# Patient Record
Sex: Female | Born: 1935 | Race: White | Hispanic: No | Smoking: Never smoker
Health system: Southern US, Community
[De-identification: ages and names within clinical notes are randomized; demographics above are authoritative.]

## PROBLEM LIST (undated history)

## (undated) DIAGNOSIS — K219 Gastro-esophageal reflux disease without esophagitis: Secondary | ICD-10-CM

## (undated) DIAGNOSIS — I509 Heart failure, unspecified: Secondary | ICD-10-CM

## (undated) DIAGNOSIS — G4700 Insomnia, unspecified: Secondary | ICD-10-CM

## (undated) DIAGNOSIS — E039 Hypothyroidism, unspecified: Secondary | ICD-10-CM

## (undated) DIAGNOSIS — I10 Essential (primary) hypertension: Secondary | ICD-10-CM

## (undated) DIAGNOSIS — I639 Cerebral infarction, unspecified: Secondary | ICD-10-CM

## (undated) DIAGNOSIS — F0390 Unspecified dementia without behavioral disturbance: Secondary | ICD-10-CM

## (undated) HISTORY — PX: ABDOMINAL HYSTERECTOMY: SHX81

---

## 2004-10-17 ENCOUNTER — Ambulatory Visit: Payer: Self-pay | Admitting: Internal Medicine

## 2004-12-22 ENCOUNTER — Emergency Department: Payer: Self-pay | Admitting: Unknown Physician Specialty

## 2004-12-28 ENCOUNTER — Other Ambulatory Visit: Payer: Self-pay

## 2004-12-28 ENCOUNTER — Inpatient Hospital Stay: Payer: Self-pay | Admitting: Internal Medicine

## 2005-12-02 ENCOUNTER — Ambulatory Visit: Payer: Self-pay | Admitting: Internal Medicine

## 2006-12-09 ENCOUNTER — Ambulatory Visit: Payer: Self-pay | Admitting: Internal Medicine

## 2008-02-08 ENCOUNTER — Ambulatory Visit: Payer: Self-pay | Admitting: Internal Medicine

## 2008-05-11 ENCOUNTER — Emergency Department: Payer: Self-pay | Admitting: Emergency Medicine

## 2008-05-16 ENCOUNTER — Ambulatory Visit: Payer: Self-pay | Admitting: Internal Medicine

## 2008-05-22 ENCOUNTER — Ambulatory Visit: Payer: Self-pay | Admitting: Internal Medicine

## 2009-02-13 ENCOUNTER — Ambulatory Visit: Payer: Self-pay | Admitting: Internal Medicine

## 2009-09-05 IMAGING — CR DG CHEST 2V
1 series · 2 of 2 positions shown · non-contrast
Comparison: none

REASON FOR EXAM: SHORTNESS OF BREATH
COMMENTS:

[Series 1: view not recorded · 0.17mm/px · 2 of 2 slices shown]
[im 1/2]
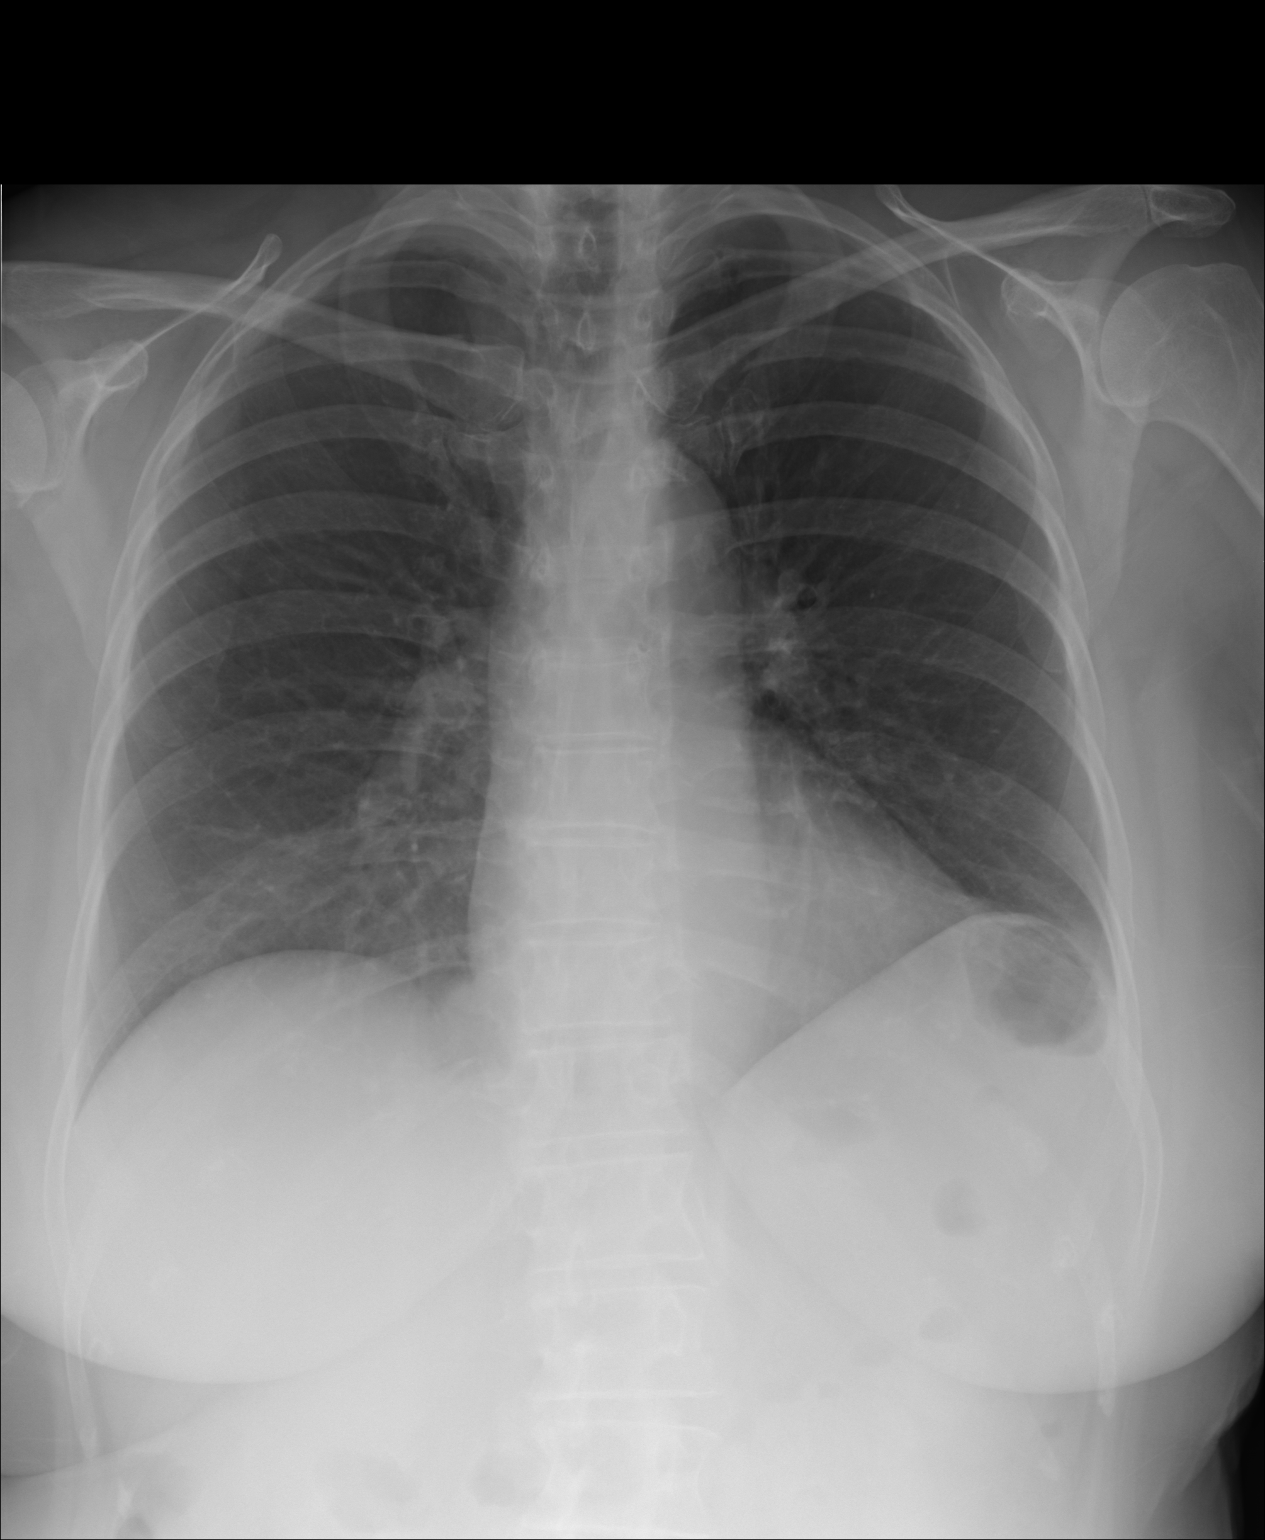
[im 2/2]
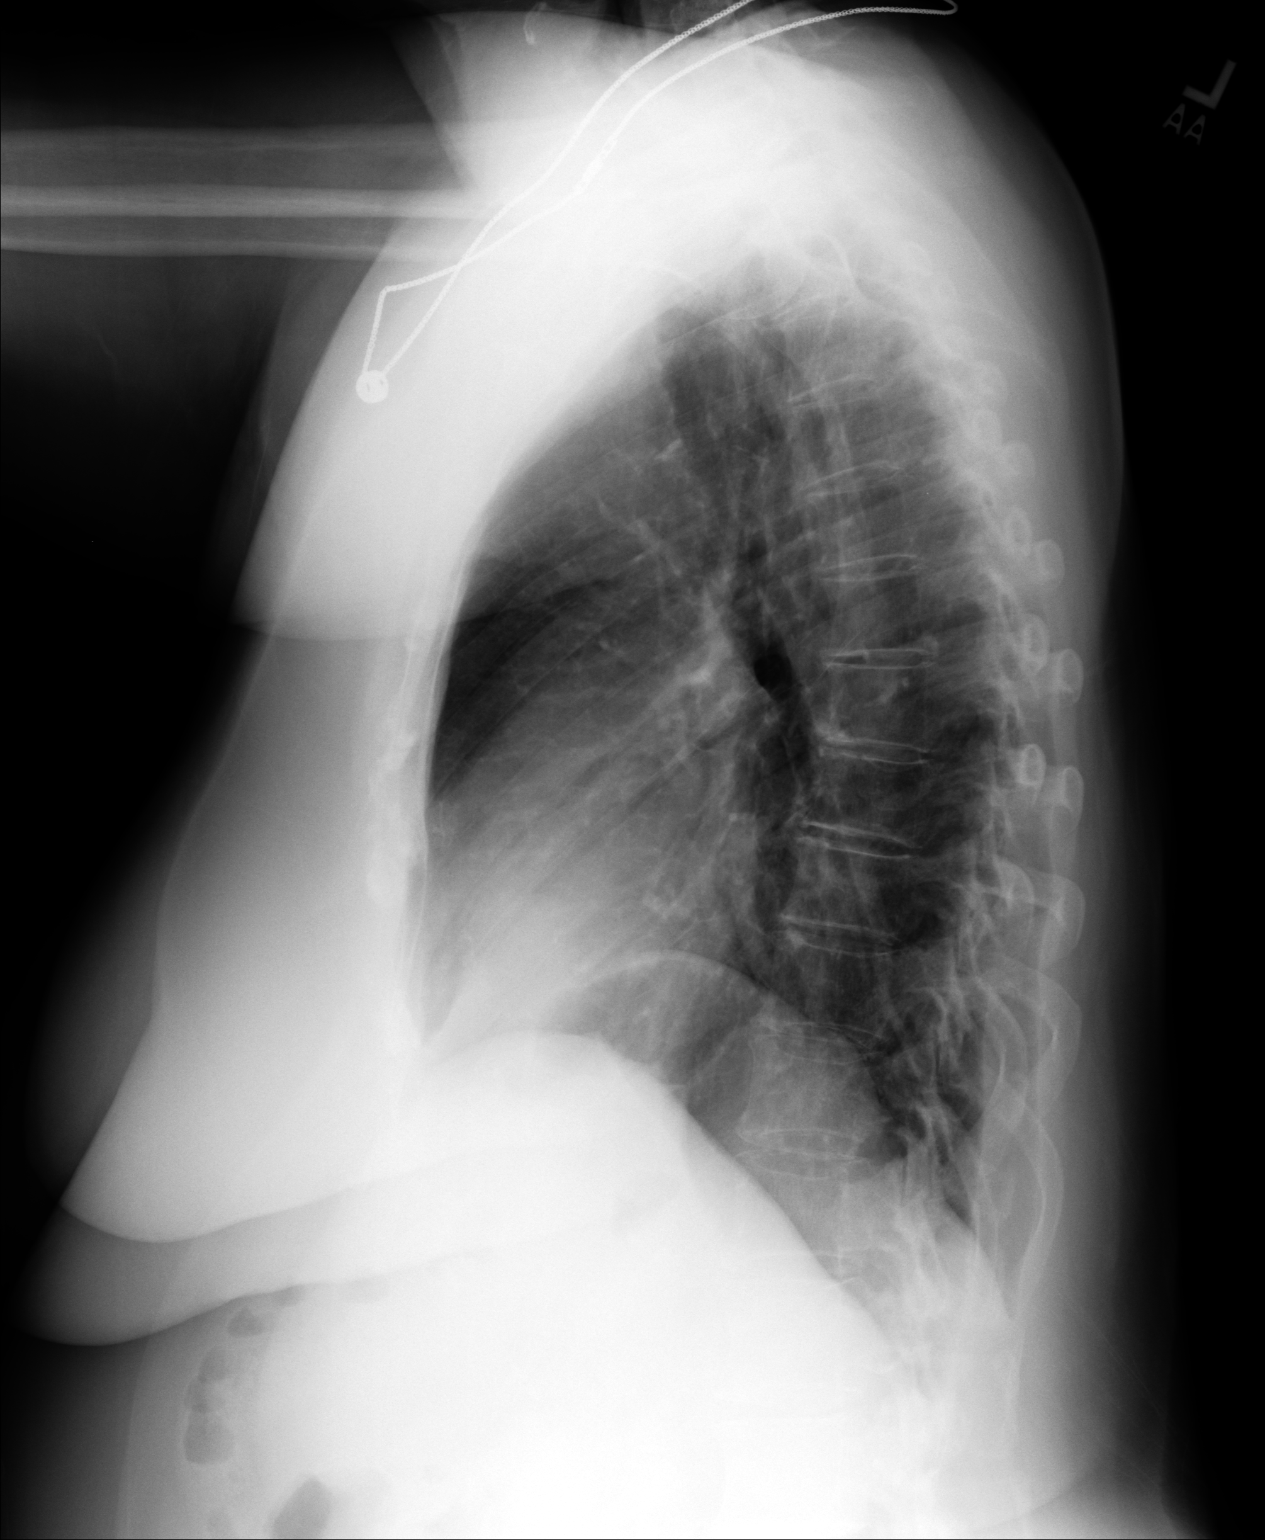

[2 of 2 positions shown; findings below may reference images not displayed]

PROCEDURE:     DXR - DXR CHEST PA (OR AP) AND LATERAL  - May 22, 2008 [DATE]

RESULT:     The patient has taken a shallow inspiration. A small area of
increased density projects along the cardiophrenic angle region on the right
in the area of the right middle lobe. This may be secondary to the patient's
shallow inspiration and repeat evaluation is recommended. No further focal
regions of consolidation or focal infiltrates are appreciated.
IMPRESSION: Area of increased density projecting in the medial aspect
of the right middle lobe. Differential considerations are atelectasis versus
infiltrate versus area of density secondary to technique. Repeat evaluation
is recommended.

## 2010-02-19 ENCOUNTER — Ambulatory Visit: Payer: Self-pay | Admitting: Internal Medicine

## 2010-04-09 ENCOUNTER — Ambulatory Visit: Payer: Self-pay | Admitting: Internal Medicine

## 2010-08-15 ENCOUNTER — Emergency Department: Payer: Self-pay | Admitting: Emergency Medicine

## 2011-03-19 ENCOUNTER — Ambulatory Visit: Payer: Self-pay | Admitting: Internal Medicine

## 2011-07-03 ENCOUNTER — Ambulatory Visit: Payer: Self-pay | Admitting: Urology

## 2011-08-03 ENCOUNTER — Inpatient Hospital Stay: Payer: Self-pay | Admitting: Internal Medicine

## 2011-08-03 LAB — CBC
HGB: 11.1 g/dL — ABNORMAL LOW (ref 12.0–16.0)
MCH: 31.5 pg (ref 26.0–34.0)
MCHC: 34.3 g/dL (ref 32.0–36.0)
MCV: 92 fL (ref 80–100)
Platelet: 293 10*3/uL (ref 150–440)
RDW: 13.6 % (ref 11.5–14.5)
WBC: 9.4 10*3/uL (ref 3.6–11.0)

## 2011-08-03 LAB — COMPREHENSIVE METABOLIC PANEL
Anion Gap: 15 (ref 7–16)
BUN: 16 mg/dL (ref 7–18)
Calcium, Total: 8.7 mg/dL (ref 8.5–10.1)
Chloride: 85 mmol/L — ABNORMAL LOW (ref 98–107)
Creatinine: 1.14 mg/dL (ref 0.60–1.30)
EGFR (African American): 58 — ABNORMAL LOW
Glucose: 107 mg/dL — ABNORMAL HIGH (ref 65–99)
Osmolality: 249 (ref 275–301)
Potassium: 3.4 mmol/L — ABNORMAL LOW (ref 3.5–5.1)
SGOT(AST): 33 U/L (ref 15–37)
Total Protein: 7 g/dL (ref 6.4–8.2)

## 2011-08-03 LAB — BASIC METABOLIC PANEL
Anion Gap: 8 (ref 7–16)
Calcium, Total: 8.7 mg/dL (ref 8.5–10.1)
Creatinine: 1.01 mg/dL (ref 0.60–1.30)
EGFR (African American): >60
EGFR (Non-African Amer.): 55 — ABNORMAL LOW
Glucose: 95 mg/dL (ref 65–99)
Osmolality: 251 (ref 275–301)
Potassium: 3.8 mmol/L (ref 3.5–5.1)
Sodium: 125 mmol/L — ABNORMAL LOW (ref 136–145)

## 2011-08-03 LAB — URINALYSIS, COMPLETE
Bacteria: NONE SEEN
Bilirubin,UR: NEGATIVE
Glucose,UR: NEGATIVE mg/dL (ref 0–75)
Nitrite: NEGATIVE
Squamous Epithelial: <1

## 2011-08-03 LAB — CK TOTAL AND CKMB (NOT AT ARMC)
CK, Total: 465 U/L — ABNORMAL HIGH (ref 21–215)
CK, Total: 497 U/L — ABNORMAL HIGH (ref 21–215)
CK-MB: 4.7 ng/mL — ABNORMAL HIGH (ref 0.5–3.6)

## 2011-08-03 LAB — CBC WITH DIFFERENTIAL/PLATELET
Basophil #: 0 10*3/uL (ref 0.0–0.1)
Basophil %: 0.5 %
Eosinophil #: 0.1 10*3/uL (ref 0.0–0.7)
HCT: 31.9 % — ABNORMAL LOW (ref 35.0–47.0)
HGB: 10.6 g/dL — ABNORMAL LOW (ref 12.0–16.0)
Lymphocyte #: 1.5 10*3/uL (ref 1.0–3.6)
MCHC: 33.4 g/dL (ref 32.0–36.0)
MCV: 91 fL (ref 80–100)
Monocyte #: 0.8 10*3/uL — ABNORMAL HIGH (ref 0.0–0.7)
Monocyte %: 11 %
Neutrophil #: 5 10*3/uL (ref 1.4–6.5)
RDW: 13.4 % (ref 11.5–14.5)

## 2011-08-03 LAB — TROPONIN I
Troponin-I: <0.02 ng/mL
Troponin-I: <0.02 ng/mL
Troponin-I: <0.02 ng/mL

## 2011-08-04 LAB — CLOSTRIDIUM DIFFICILE BY PCR

## 2011-08-05 LAB — CBC WITH DIFFERENTIAL/PLATELET
Basophil #: 0 10*3/uL (ref 0.0–0.1)
Basophil %: 0.6 %
Eosinophil #: 0 10*3/uL (ref 0.0–0.7)
Eosinophil %: 0.5 %
HCT: 34.2 % — ABNORMAL LOW (ref 35.0–47.0)
HGB: 11.5 g/dL — ABNORMAL LOW (ref 12.0–16.0)
Lymphocyte #: 1.2 10*3/uL (ref 1.0–3.6)
Lymphocyte %: 15.5 %
MCH: 30.9 pg (ref 26.0–34.0)
MCHC: 33.6 g/dL (ref 32.0–36.0)
MCV: 92 fL (ref 80–100)
Monocyte #: 0.8 10*3/uL — ABNORMAL HIGH (ref 0.0–0.7)
Monocyte %: 10.5 %
Neutrophil #: 5.6 10*3/uL (ref 1.4–6.5)
Neutrophil %: 72.9 %
Platelet: 341 10*3/uL (ref 150–440)
RBC: 3.72 10*6/uL — ABNORMAL LOW (ref 3.80–5.20)
RDW: 13.6 % (ref 11.5–14.5)
WBC: 7.8 10*3/uL (ref 3.6–11.0)

## 2011-08-05 LAB — BASIC METABOLIC PANEL
Anion Gap: 16 (ref 7–16)
BUN: 10 mg/dL (ref 7–18)
Calcium, Total: 9.1 mg/dL (ref 8.5–10.1)
Chloride: 92 mmol/L — ABNORMAL LOW (ref 98–107)
Co2: 20 mmol/L — ABNORMAL LOW (ref 21–32)
Creatinine: 0.98 mg/dL (ref 0.60–1.30)
EGFR (African American): >60
EGFR (Non-African Amer.): 57 — ABNORMAL LOW
Glucose: 111 mg/dL — ABNORMAL HIGH (ref 65–99)
Osmolality: 257 (ref 275–301)
Potassium: 4.5 mmol/L (ref 3.5–5.1)
Sodium: 128 mmol/L — ABNORMAL LOW (ref 136–145)

## 2012-01-10 ENCOUNTER — Emergency Department: Payer: Self-pay | Admitting: Emergency Medicine

## 2012-01-10 LAB — CBC
HCT: 36.2 % (ref 35.0–47.0)
HGB: 12.3 g/dL (ref 12.0–16.0)
MCH: 30.5 pg (ref 26.0–34.0)
MCHC: 33.9 g/dL (ref 32.0–36.0)
MCV: 90 fL (ref 80–100)
RBC: 4.02 10*6/uL (ref 3.80–5.20)
RDW: 13.6 % (ref 11.5–14.5)
WBC: 8.2 10*3/uL (ref 3.6–11.0)

## 2012-01-10 LAB — PROTIME-INR
INR: 0.8
Prothrombin Time: 11.8 secs (ref 11.5–14.7)

## 2012-03-31 ENCOUNTER — Ambulatory Visit: Payer: Self-pay | Admitting: Family Medicine

## 2012-06-09 ENCOUNTER — Emergency Department: Payer: Self-pay | Admitting: Emergency Medicine

## 2012-06-20 ENCOUNTER — Emergency Department: Payer: Self-pay | Admitting: Unknown Physician Specialty

## 2012-10-26 ENCOUNTER — Emergency Department: Payer: Self-pay | Admitting: Emergency Medicine

## 2012-11-20 ENCOUNTER — Emergency Department: Payer: Self-pay | Admitting: Emergency Medicine

## 2013-01-21 ENCOUNTER — Emergency Department: Payer: Self-pay | Admitting: Emergency Medicine

## 2013-03-09 ENCOUNTER — Emergency Department: Payer: Self-pay | Admitting: Emergency Medicine

## 2013-03-09 LAB — COMPREHENSIVE METABOLIC PANEL
Alkaline Phosphatase: 115 U/L (ref 50–136)
Anion Gap: 6 — ABNORMAL LOW (ref 7–16)
Co2: 27 mmol/L (ref 21–32)
Creatinine: 1.3 mg/dL (ref 0.60–1.30)
Glucose: 103 mg/dL — ABNORMAL HIGH (ref 65–99)
Potassium: 3.7 mmol/L (ref 3.5–5.1)
SGPT (ALT): 18 U/L (ref 12–78)
Sodium: 138 mmol/L (ref 136–145)
Total Protein: 6.8 g/dL (ref 6.4–8.2)

## 2013-03-09 LAB — CBC
MCHC: 34.5 g/dL (ref 32.0–36.0)
MCV: 86 fL (ref 80–100)
Platelet: 349 10*3/uL (ref 150–440)
RBC: 3.85 10*6/uL (ref 3.80–5.20)
RDW: 13.6 % (ref 11.5–14.5)

## 2013-03-09 LAB — URINALYSIS, COMPLETE
Bacteria: NONE SEEN
Bilirubin,UR: NEGATIVE
Blood: NEGATIVE
Glucose,UR: NEGATIVE mg/dL (ref 0–75)
Leukocyte Esterase: NEGATIVE
Nitrite: NEGATIVE
Protein: NEGATIVE
RBC,UR: 1 /HPF (ref 0–5)
Specific Gravity: 1.011 (ref 1.003–1.030)

## 2013-03-09 LAB — TROPONIN I: Troponin-I: <0.02 ng/mL

## 2013-03-09 LAB — CK TOTAL AND CKMB (NOT AT ARMC): CK-MB: 2.5 ng/mL (ref 0.5–3.6)

## 2014-09-10 NOTE — Discharge Summary (Signed)
PATIENT NAME:  Abigail, Mcclure MR#:  [id number] DATE OF BIRTH:  04-13-1922  DATE OF ADMISSION:  08/03/2011 DATE OF DISCHARGE:  08/06/2011  HISTORY OF PRESENT ILLNESS: Abigail Mcclure is an 81 year old female who was admitted into the hospital with recurrent falls at home for the last four days. She was admitted into the hospital with change in mental status, rule out transient ischemic attack, rule out electrolyte imbalance. For the rest of the details of the history and physical, please see typed history and physical sheet. The patient is known to have hypertension, dyslipidemia, kidney stone, and ulcer. She lives with her son. She has loss of memory.  CURRENT MEDICATIONS:  1. Simvastatin 10 mg p.o. daily. 2. Hydrochlorothiazide 12.5 mg p.o. daily. 3. Nitrofurantoin 100 mg p.o. daily. 4. K-Lor 20 milliequivalents daily. 5. Captopril 50 mg p.o. three times daily. 6. Fiorinal 50/325/40 mg one capsule by mouth every day as needed for headache. 7. Amlodipine 10 mg p.o. daily. 8. Metoprolol ER 25 mg one tablet p.o. three times daily. 9. Gabapentin 100 mg two capsules p.o. at bedtime for migraine headaches.   HOSPITAL COURSE: The patient was admitted into the hospital with the working diagnosis of TIA versus electrolyte imbalance. However, her sodium was found to be very low which may be secondary to diuretic. The patient was not found to have any focal neurological signs. The patient does not have any evidence of stroke or severe carotid artery disease on the carotid ultrasound, MRA, and MRI. CT scan did not show any bleed. The patient remained alert, responsive. Hyponatremia was slowly corrected. It was 116 to start with and 128 at the time of discharge. Hydrochlorothiazide was discontinued. She was hypotensive when she came in, but her blood pressure went back up and had to be started on her previous blood pressure medication. I had a detailed discussion with the son. It was decided to send her to a nursing  home.   DISCHARGE LABS/STUDIES: Blood sugar 111. Sodium 128. GFR 57. Hematocrit 34.2.   Urinalysis was unremarkable.   EKG did not show any acute changes.   FINAL DIAGNOSES:  1. Hyponatremia causing frequent falls. No evidence of acute bleed or stroke on imaging studies.  2. Hypertension with hypertensive cardiovascular disease  3. History of loss of memory with mild dementia.   DISPOSITION: The patient is being sent to a nursing home.  DISCHARGE MEDICATIONS: 1. Aspirin 81 mg p.o. daily.  2. Protonix 40 mg p.o. daily.  3. Simvastatin 10 mg p.o. daily.  4. Macrodantin 100 mg p.o. daily.  5. Neurontin 200 mg p.o. at bedtime.  6. Metoprolol 25 mg p.o. every 8 hours. 7. Fiorinal 325/50/40 mg one tablet p.o. p.r.n. once or twice a day for pain. 8. Potassium chloride 20 milliequivalents p.o. daily.  9. Captopril 50 mg p.o. every eight hours.  ____________________________ Corky Downs, MD jm:slb D: 08/06/2011 14:34:23 ET T: 08/06/2011 15:27:43 ET JOB#: [id number]  cc: Corky Downs, MD, <Dictator> Corky Downs MD ELECTRONICALLY SIGNED 08/10/2011 19:57

## 2014-09-10 NOTE — H&P (Signed)
PATIENT NAME:  Abigail, Mcclure MR#:  [id number] DATE OF BIRTH:  [birth year]  DATE OF ADMISSION:  08/03/2011  PRIMARY CARE PHYSICIAN: Abigail Downs, MD  INDICATION FOR ADMISSION: Hyponatremia, weakness, concern for TIA versus CVA.   CHIEF COMPLAINT: "Weakness, confusion, and recurrent falls for the last four days (per son)."  HISTORY OF PRESENT ILLNESS: Abigail Mcclure is a pleasant 81 year old Caucasian female with past medical history of hypertension, hyperlipidemia, and hyponatremia who presented with weakness, confusion, and recurrent falls for the past four days.  History is per the son and some from the patient. Her son states that on Wednesday the patient had a fall. She uses a walker and fell back and hit her head. At that time she had a small bruise on her head, but she did not seem to have any acute change in her mental status or overall strength in her daily activities. However, over the past four days he has noticed that the right eye is starting to become a little bit more droopy in the mornings and her right foot has turned inward.  Today she had another fall where she kind of slipped down to the ground. She was too weak to use her walker.  The son also noticed that she has been urinating a lot more than normal. No fevers, no nausea, vomiting or diarrhea, and no shortness of breath. The son states that she was admitted for hyponatremia about 4 to 5 years ago by Dr. Corky Mcclure and she chronically has low sodium, but he does not know what her baseline sodium is.  Upon arrival to the ED, her sodium was noted to be 123. She was noted to have bruises on her forearm and on her back secondary to falls. Hospitalist services were consulted for further inpatient and management and treatment.   PAST MEDICAL HISTORY:  1. Hypertension. 2. Hyperlipidemia. 3. History of kidney stones. 4. History of ulcers.  PAST SURGICAL HISTORY: None.  DRUG ALLERGIES: No known drug allergies.  HOME MEDICATIONS:   1. Simvastatin 10 mg daily. 2. Hydrochlorothiazide 12.5 mg daily and 25 mg daily (the patient has two prescriptions). 3. Nitrofurantoin 100 mg daily. 4. Klor-Con 20 milliequivalents one tablet daily. 5. Captopril 50 mg three times daily. 6. Fiorinal capsules 50/325/40 mg take one capsule by mouth every day as needed for pain.  7. Amlodipine 10 mg one tablet daily. 8. Metoprolol ER 25 mg one tablet three times daily.  9. Gabapentin 100 mg two capsules p.o. at bedtime.  FAMILY HISTORY: Mother and father and two brothers died of a CVA.   SOCIAL HISTORY: No tobacco or alcohol use. She is a retired Education officer, environmental.   REVIEW OF SYSTEMS: (Per the son and not per the patient) CONSTITUTIONAL: No fever or fatigue. Positive weakness. No pain, weight loss or weight gain.  EYES: No blurred vision, double vision, pain, or redness. ENT: No tinnitus, ear pain, hearing loss, or seasonal allergies. RESPIRATORY: No cough, wheezing, dyspnea, or asthma. CARDIOVASCULAR: No chest pain, orthopnea, edema, arrhythmias, or dyspnea on exertion. GI: No nausea, vomiting, diarrhea, or abdominal pain. GU: No dysuria or hematuria or renal calculi. ENDOCRINE: No polyuria, nocturia, or thyroid problems. HEME/LYMPH: No anemia, easy bruisability or swollen glands. INTEGUMENT: No acne, rash, lesions, or change in hair, mole, or skin. MUSCULOSKELETAL: No pain in the neck, back, shoulders, knees, or hips. Chronic arthritis and uses a walker. NEURO: No numbness. Positive weakness and confusion. PSYCH: No anxiety, insomnia, ADD, OCD, bipolar, depression, schizophrenia or nervousness.  PHYSICAL EXAMINATION:  VITALS:  Blood pressure 141/60, temperature 98.2, pulse 68, respirations 17.  GENERAL APPEARANCE: Well-developed, well-nourished obese female lying in bed in no acute respiratory distress.  HEENT:  Pupils are equally round and reactive to light and accommodation. Extraocular movements are intact. No scleral icterus. No  conjunctivitis. No difficulty hearing. Tympanic membranes are intact.    NECK: No thyroid enlargement. No nodules. Neck is supple and nontender. No adenopathy is noted.   RESPIRATORY: Clear to auscultation bilaterally. No rales, rhonchi, or crackles. No diminished breath sounds. No wheezing.  CARDIOVASCULAR: Regular rate and rhythm. S1 and S2. No murmurs. No lower extremity edema is noted.   ABDOMEN: Soft and nontender, nondistended. Positive bowel sounds.   MUSCULOSKELETAL: 4/5 strength in the right upper and right lower extremity, 5/5 strength in the left upper and left lower extremity.   SKIN: No rash. No erythema. No nodules. Skin is warm and dry.  There is a large bruise noted on the right forearm and on her back area.  LYMPH: No adenopathy noted in the cervical, axillary, or supraclavicular regions.   NEURO: Cranial nerves II through XII grossly intact. Deep tendon reflexes intact. Sensory is intact. She does have some mild slowing of her speech, but is able to answer distant memory questions appropriately.   PSYCH: Alert, oriented to place but not to time. Cooperative. Good decent judgment.   LABS/STUDIES: Sodium 123, potassium 3.4, chloride 85, bicarbonate 23, BUN 16, creatinine 1.14, glucose 107. White blood cell count 9.4, hemoglobin 11.1, hematocrit 32.5, platelet count 293.   Urinalysis: Leukocyte esterase negative. Nitrite negative. White blood cells - none seen.   CT of the head: No acute abnormalities. No acute bleed.   EKG: Normal sinus rhythm, first degree AV block, rate of about 70. No acute ST-T wave changes.   ASSESSMENT AND PLAN: An 81 year old female with past medical history of hypertension, hyperlipidemia, history of hyponatremia, and UTI being admitted for weakness, hyponatremia, confusion, and three falls in the past four days. 1. Weakness, confusion, and falls: Most likely secondary to TIA versus CVA. I believe the inciting event may have occurred on Wednesday  after her first fall. Although it may be related to trauma, we cannot completely rule out a CVA that lead to the fall. We will fully evaluate with MRI, MRA, and carotid ultrasounds. She may need a 2D echocardiogram of her heart. Her primary care physician is Dr. Corky Mcclure and we will defer Echo ordering to him. CT of the head is currently with no acute bleed or acute abnormalities. She will need followup of her CBC to monitor her hemoglobin and her sodium. We will monitor with a BMP in the a.m. Her urinalysis is currently negative for UTI. She is on nitrofurantoin chronically, by Dr. Corky Mcclure, and we will continue that.  2. Hyponatremia: Most likely acute on chronic. There is no record of a baseline sodium. She has been admitted in the past for hyponatremia, as low as 116. In the ED, she was started on 75 mL of normal saline. We will reduce this to about 50 mL of normal saline so as not to drastically increase her sodium in the event she has chronic hyponatremia. Ideally in the next 6 to 8 hours, we will probably prefer her sodium to be around 120 to 130 and not much more higher than that.   3. Hypertension: Continue with home medications of captopril and hydrochlorothiazide. However, we will hold hydrochlorothiazide right now secondary to hyponatremia. This  can be resumed by her primary care physician. 4. Hyperlipidemia: Continue with simvastatin.  5. GI and DVT prophylaxis: Continue with PPI and heparin.   CODE STATUS: Per the patient's son, the patient is  DNI/ DNR. She has a signed Living Will.   TIME SPENT DICTATING AND EVALUATING PATIENT: 40 minutes. ____________________________ Stephanie Acre, MD vm:slb D: 08/03/2011 02:23:13 ET T: 08/03/2011 09:21:10 ET JOB#: [id number]  cc: Stephanie Acre, MD, <Dictator> Abigail Downs, MD Stephanie Acre MD ELECTRONICALLY SIGNED 08/03/2011 22:16

## 2014-09-22 ENCOUNTER — Other Ambulatory Visit: Payer: Self-pay

## 2014-09-22 ENCOUNTER — Emergency Department
Admission: EM | Admit: 2014-09-22 | Discharge: 2014-09-22 | Payer: Medicare Other | Attending: Student | Admitting: Student

## 2014-09-22 ENCOUNTER — Emergency Department: Payer: Medicare Other

## 2014-09-22 ENCOUNTER — Ambulatory Visit (HOSPITAL_COMMUNITY)
Admission: AD | Admit: 2014-09-22 | Discharge: 2014-09-22 | Disposition: A | Discharge status: Home / Self Care | Payer: Medicare Other | Source: Other Acute Inpatient Hospital | Attending: Student | Admitting: Student

## 2014-09-22 ENCOUNTER — Encounter: Payer: Self-pay | Admitting: Emergency Medicine

## 2014-09-22 DIAGNOSIS — W050XXA Fall from non-moving wheelchair, initial encounter: Secondary | ICD-10-CM | POA: Insufficient documentation

## 2014-09-22 DIAGNOSIS — Y9289 Other specified places as the place of occurrence of the external cause: Secondary | ICD-10-CM | POA: Insufficient documentation

## 2014-09-22 DIAGNOSIS — I10 Essential (primary) hypertension: Secondary | ICD-10-CM | POA: Diagnosis not present

## 2014-09-22 DIAGNOSIS — S12000A Unspecified displaced fracture of first cervical vertebra, initial encounter for closed fracture: Secondary | ICD-10-CM | POA: Diagnosis not present

## 2014-09-22 DIAGNOSIS — Z7982 Long term (current) use of aspirin: Secondary | ICD-10-CM | POA: Insufficient documentation

## 2014-09-22 DIAGNOSIS — Y998 Other external cause status: Secondary | ICD-10-CM | POA: Insufficient documentation

## 2014-09-22 DIAGNOSIS — W19XXXA Unspecified fall, initial encounter: Secondary | ICD-10-CM

## 2014-09-22 DIAGNOSIS — Y9389 Activity, other specified: Secondary | ICD-10-CM | POA: Diagnosis not present

## 2014-09-22 DIAGNOSIS — W01198A Fall on same level from slipping, tripping and stumbling with subsequent striking against other object, initial encounter: Secondary | ICD-10-CM | POA: Diagnosis not present

## 2014-09-22 DIAGNOSIS — S0181XA Laceration without foreign body of other part of head, initial encounter: Secondary | ICD-10-CM | POA: Diagnosis not present

## 2014-09-22 DIAGNOSIS — R296 Repeated falls: Secondary | ICD-10-CM | POA: Diagnosis not present

## 2014-09-22 DIAGNOSIS — S129XXA Fracture of neck, unspecified, initial encounter: Secondary | ICD-10-CM | POA: Diagnosis present

## 2014-09-22 DIAGNOSIS — Z79899 Other long term (current) drug therapy: Secondary | ICD-10-CM | POA: Diagnosis not present

## 2014-09-22 DIAGNOSIS — Z23 Encounter for immunization: Secondary | ICD-10-CM | POA: Diagnosis not present

## 2014-09-22 DIAGNOSIS — T1490XA Injury, unspecified, initial encounter: Secondary | ICD-10-CM

## 2014-09-22 DIAGNOSIS — S3992XA Unspecified injury of lower back, initial encounter: Secondary | ICD-10-CM | POA: Diagnosis present

## 2014-09-22 HISTORY — DX: Insomnia, unspecified: G47.00

## 2014-09-22 HISTORY — DX: Hypothyroidism, unspecified: E03.9

## 2014-09-22 HISTORY — DX: Gastro-esophageal reflux disease without esophagitis: K21.9

## 2014-09-22 HISTORY — DX: Heart failure, unspecified: I50.9

## 2014-09-22 HISTORY — DX: Essential (primary) hypertension: I10

## 2014-09-22 HISTORY — DX: Unspecified dementia, unspecified severity, without behavioral disturbance, psychotic disturbance, mood disturbance, and anxiety: F03.90

## 2014-09-22 LAB — BASIC METABOLIC PANEL
ANION GAP: 8 (ref 5–15)
BUN: 18 mg/dL (ref 6–20)
CO2: 30 mmol/L (ref 22–32)
CREATININE: 1.03 mg/dL — AB (ref 0.44–1.00)
Calcium: 9.2 mg/dL (ref 8.9–10.3)
Chloride: 102 mmol/L (ref 101–111)
GFR, EST AFRICAN AMERICAN: 54 mL/min — AB (ref >60)
GFR, EST NON AFRICAN AMERICAN: 46 mL/min — AB (ref >60)
Glucose, Bld: 89 mg/dL (ref 65–99)
Potassium: 4 mmol/L (ref 3.5–5.1)
Sodium: 140 mmol/L (ref 135–145)

## 2014-09-22 LAB — CBC
HEMATOCRIT: 43.4 % (ref 35.0–47.0)
Hemoglobin: 14.2 g/dL (ref 12.0–16.0)
MCH: 29.7 pg (ref 26.0–34.0)
MCHC: 32.7 g/dL (ref 32.0–36.0)
MCV: 91.1 fL (ref 80.0–100.0)
PLATELETS: 261 10*3/uL (ref 150–440)
RBC: 4.76 MIL/uL (ref 3.80–5.20)
RDW: 14.9 % — ABNORMAL HIGH (ref 11.5–14.5)
WBC: 8.1 10*3/uL (ref 3.6–11.0)

## 2014-09-22 LAB — PROTIME-INR
INR: 0.94
Prothrombin Time: 12.8 seconds (ref 11.4–15.0)

## 2014-09-22 MED ORDER — LIDOCAINE-EPINEPHRINE (PF) 1 %-1:200000 IJ SOLN
INTRAMUSCULAR | Status: AC
  Administered 2014-09-22: 19:00:00 via SUBCUTANEOUS
  Filled 2014-09-22: qty 30

## 2014-09-22 MED ORDER — LIDOCAINE-EPINEPHRINE 1 %-1:100000 IJ SOLN: 30.0000 mL | Freq: Once | INTRAMUSCULAR | Status: DC

## 2014-09-22 MED ORDER — FENTANYL CITRATE (PF) 100 MCG/2ML IJ SOLN
INTRAMUSCULAR | Status: AC
  Administered 2014-09-22: 50 ug via INTRAVENOUS
  Filled 2014-09-22: qty 2

## 2014-09-22 MED ORDER — TETANUS-DIPHTH-ACELL PERTUSSIS 5-2.5-18.5 LF-MCG/0.5 IM SUSP
0.5000 mL | Freq: Once | INTRAMUSCULAR | Status: AC
  Administered 2014-09-22: 0.5 mL via INTRAMUSCULAR

## 2014-09-22 MED ORDER — LIDOCAINE HCL (PF) 1 % IJ SOLN
30.0000 mL | Freq: Once | INTRAMUSCULAR | Status: DC
  Filled 2014-09-22: qty 30

## 2014-09-22 MED ORDER — SODIUM CHLORIDE 0.9 % IV SOLN: INTRAVENOUS | Status: DC

## 2014-09-22 MED ORDER — FENTANYL CITRATE (PF) 100 MCG/2ML IJ SOLN
50.0000 ug | Freq: Once | INTRAMUSCULAR | Status: AC
  Administered 2014-09-22: 50 ug via INTRAVENOUS

## 2014-09-22 MED ORDER — TETANUS-DIPHTH-ACELL PERTUSSIS 5-2.5-18.5 LF-MCG/0.5 IM SUSP
INTRAMUSCULAR | Status: AC
  Administered 2014-09-22: 0.5 mL via INTRAMUSCULAR
  Filled 2014-09-22: qty 0.5

## 2014-09-22 NOTE — ED Notes (Signed)
Pt placed on backboard. Pt in C-spine throughout process. Pt remains in C-collar.

## 2014-09-22 NOTE — ED Notes (Signed)
Called [facility] transport center. [facility] transport center reports [facility] will call for report in 10 minutes.

## 2014-09-22 NOTE — ED Notes (Signed)
[facility] at bedside 

## 2014-09-22 NOTE — ED Notes (Addendum)
Ems from [facility] s/p fall from wheel chair. Right head lac x 2, right shoulder red

## 2014-09-22 NOTE — ED Provider Notes (Signed)
[facility] Emergency Department Provider Note    ____________________________________________  Time seen: 3:05 pm  I have reviewed the triage vital signs and the nursing notes.   HISTORY  Chief Complaint Fall  History and physical are limited secondary to the patient's inability to cooperate, nonverbal state, inability to follow commands. All information obtained from staff at [facility].     HPI Abigail Mcclure is a 81 y.o. female with past medical history significant for CHF hypothyroidism, dementia, prior CVA, frequent falls, hypertension, nonverbal completely care dependent who presents for evaluation of witnessed fall at [facility]. Per report, the patient was sitting in her wheelchair, she likely attempted to reach for something in front of her and fell onto the floor hitting her face. [facility] called staff to the room immediately where she was found awake, no loss of consciousness. Apparently she is prone to falls and previously required restraint while sitting in her wheelchair however this had been discontinued. Staff at [facility] reports she has had no recent illness, including no cough, sneezing, runny nose, congestion, vomiting diarrhea fevers or chills. Onset sudden. This occurred just prior to arrival. Unable to obtain pain level/severity level. Patient is unable to describe the quality of the pain. No new changes in meds no recent illness. No associated or modifying factors.     Past Medical History  Diagnosis Date  . CHF (congestive heart failure)   . GERD (gastroesophageal reflux disease)   . Insomnia   . Hypothyroidism   . Hypertension     There are no active problems to display for this patient.   No past surgical history on file.  Current Outpatient Rx  Name  Route  Sig  Dispense  Refill  . acetaminophen (TYLENOL) 325 MG tablet   Oral   Take 650 mg by mouth every 4 (four) hours as needed.         Marland Kitchen aspirin 81 MG  EC tablet   Oral   Take 81 mg by mouth daily. Swallow whole.         . Cyanocobalamin 1000 MCG/ML KIT   Injection   Inject 1,000 mcg as directed daily. Inject 1 ml intramuscularly one time a day starting on the 15th and ending on the 15th         . divalproex (DEPAKOTE SPRINKLE) 125 MG capsule   Oral   Take 250 mg by mouth 2 (two) times daily.         . furosemide (LASIX) 20 MG tablet   Oral   Take 20 mg by mouth 2 (two) times daily.         Marland Kitchen gabapentin (NEURONTIN) 100 MG capsule   Oral   Take 200 mg by mouth at bedtime.         Marland Kitchen levothyroxine (SYNTHROID, LEVOTHROID) 88 MCG tablet   Oral   Take 88 mcg by mouth daily before breakfast.         . loratadine (CLARITIN) 10 MG tablet   Oral   Take 10 mg by mouth daily.         Marland Kitchen LORazepam (ATIVAN) 0.5 MG tablet   Oral   Take 0.25 mg by mouth every 8 (eight) hours as needed for anxiety.         . metoprolol succinate (TOPROL-XL) 25 MG 24 hr tablet   Oral   Take 25 mg by mouth daily.         . mineral oil liquid  Oral   Take 5 mLs by mouth daily.         . montelukast (SINGULAIR) 10 MG tablet   Oral   Take 10 mg by mouth daily.         Marland Kitchen omeprazole (PRILOSEC) 20 MG capsule   Oral   Take 20 mg by mouth daily.         . potassium chloride (KLOR-CON) 20 MEQ packet   Oral   Take 20 mEq by mouth 2 (two) times daily.         . simvastatin (ZOCOR) 10 MG tablet   Oral   Take 10 mg by mouth at bedtime.         . traZODone (DESYREL) 50 MG tablet   Oral   Take 50 mg by mouth at bedtime.           Allergies Review of patient's allergies indicates no known allergies.  No family history on file.  Social History History  Substance Use Topics  . Smoking status: Not on file  . Smokeless tobacco: Not on file  . Alcohol Use: Not on file    Review of Systems  Constitutional: Negative for fever. Respiratory: Negative for shortness of breath. Gastrointestinal: Negative for vomiting  and diarrhea. Skin: Negative for rash. unable to obtain full review of systems as stated above. ____________________________________________   PHYSICAL EXAM:  VITAL SIGNS: ED Triage Vitals  Enc Vitals Group     BP 09/22/14 1448 141/94 mmHg     Pulse Rate 09/22/14 1448 75     Resp --      Temp 09/22/14 1448 97.8 F (36.6 C)     Temp Source 09/22/14 1448 Oral     SpO2 09/22/14 1448 100 %     Weight --      Height --      Head Cir --      Peak Flow --      Pain Score --      Pain Loc --      Pain Edu? --      Excl. in [place]? --      Constitutional: Awake and alert, nonverbal but does moan in pain, does not follow commands Eyes: Conjunctivae are normal. PERRL.  extraocular movements appear intact. ENT   Head: 3 cm deep linear laceration of the appear right forehead and 2 cm deep linear laceration inferior to that. Right infraorbital edema with ecchymosis and swelling   Nose: Swelling/deformity of the nose   Mouth/Throat: Mucous membranes are moist.   Neck: No stridor. No apparent midline C-spine tenderness, no bony step-off or deformity Hematological/Lymphatic/Immunilogical: No cervical lymphadenopathy. Cardiovascular: Normal rate, regular rhythm. Normal and symmetric distal pulses are present in all extremities. No murmurs, rubs, or gallops. Respiratory: Normal respiratory effort without tachypnea nor retractions. Breath sounds are clear and equal bilaterally. No wheezes/rales/rhonchi. Gastrointestinal: Soft and nontender. No distention. No abdominal bruits. There is no CVA tenderness. Genitourinary: Deferred Musculoskeletal: Mild erythema of the right shoulder with tenderness, small skin tear associate with the right wrist with associated tenderness, question healing ecchymosis of the posterior chest wall on the right, full range of motion of bilateral hips Neurologic: Nonverbal, does not follow commands, does make eye contact purposefully when i am speaking to her,  limited spontaneous movement of the RUE which is chronic since CVA, is to move the left upper extremity and bilateral lower extremities equally but does not follow commands/participate in formal neurological testing. Skin:  Skin is  warm, dry. No rash noted. Psychiatric: Mood and affect are consistent with her chronic medical conditions.  ____________________________________________    LABS (pertinent positives/negatives)  Labs unremmarkable.  ____________________________________________   EKG  none  ____________________________________________    RADIOLOGY  EXAM: RIGHT SHOULDER - 2+ VIEW; RIGHT WRIST - COMPLETE 3+ VIEW; BILATERAL HIP (WITH PELVIS) 2 VIEWS  COMPARISON: Right hand radiographs 01/21/2013, right hip films 11/20/2012  FINDINGS: Right shoulder:  The joint spaces are maintained. Mild glenohumeral and AC joint degenerative changes. No acute shoulder fracture. The visualized right upper ribs are intact in the right upper lung is clear.  Right wrist:  The joint spaces are maintained. No acute wrist fracture is identified.  Right hip:  Both hips are normally located. Moderate degenerative changes with joint space narrowing and osteophytic spurring. No acute fractures identified. The pubic symphysis and SI joints are grossly intact. No pelvic fractures.  IMPRESSION: No acute bony findings.    CXR: No acute cardiopulmonary abnormality.  IMPRESSION: HEAD CT: No acute intracranial abnormality. Right frontal scalp contusion/ hematoma. No skull fracture.  MAXILLOFACIAL CT: No fracture.  CERVICAL CT: Fracture of the right and left aspects of the anterior arch of C1 without displacement or malalignment of C1 in relation to the skullbase or C2. No other fracture or acute finding.   ____________________________________________   PROCEDURES  Procedure(s) performed: See Lac repair note LACERATION REPAIR Performed by: Joanne Gavel Authorized  by: Loura Pardon A Consent: Verbal consent obtained. Risks and benefits: risks, benefits and alternatives were discussed Consent given by: patient Patient identity confirmed: provided demographic data Prepped and Draped in normal sterile fashion Wound explored  Laceration Location: forehead  Laceration Length: 2.5 cm deep stellate wound with multiple flaps, also 2 cm linear laceration  No Foreign Bodies seen or palpated  Anesthesia: local infiltration  Local anesthetic: lidocaine 1% with epinephrine  Anesthetic total: 7 ml  Irrigation method: syringe Amount of cleaning: standard  Skin closure: 4-0 prolene  Number of sutures: 5 and 3 = 8 total   Technique: simple interrupted  Patient tolerance: Patient tolerated the procedure well with no immediate complications.  Critical Care performed: No  ____________________________________________   INITIAL IMPRESSION / ASSESSMENT AND PLAN / ED COURSE  Pertinent labs & imaging results that were available during my care of the patient were reviewed by me and considered in my medical decision making (see chart for details).  Abigail Mcclure is a 81 y.o. female with past medical history significant for CHF hypothyroidism, dementia, prior CVA with rightsided deficits, frequent falls, hypertension, nonverbal completely care dependent who presents for evaluation of witnessed fall at [facility].  ----------------------------------------- 6:50 PM on 09/22/2014 -----------------------------------------  Imaging remarkable for C1 fracture. C-collar in place. According to her son/healthcare power of attorney who is at bedside, her current neurological exam with the right-sided weakness is her baseline secondary to stroke. No other cute traumatic pathology noted on imaging. Discussed with [facility] Vernell Leep at [facility] ER who will accept the patient as she needs to be seen by a neurosurgeon. Patient's son is comfortable with this plan. Will give tdap as  son does not know when she last received a tetanus vaccine.  ----------------------------------------- 10:54 PM on 09/22/2014 -----------------------------------------  Vital signs remain stable. The patient is resting comfortably. She has had no change in her neurological examination.[facility] here to transport. Approximately an hour and a half ago there was some oozing from the smaller laceration on her forehead, I added an additional 2 figure of  8 stitches with 4-0 Prolene and Surgicel and bleeding appears well controlled.  ____________________________________________   FINAL CLINICAL IMPRESSION(S) / ED DIAGNOSES  Final diagnoses:  Trauma  C1 cervical fracture, closed, initial encounter     Joanne Gavel, MD 09/22/14 2256

## 2015-02-15 ENCOUNTER — Emergency Department: Payer: Medicare Other

## 2015-02-15 ENCOUNTER — Encounter: Payer: Self-pay | Admitting: Emergency Medicine

## 2015-02-15 ENCOUNTER — Emergency Department
Admission: EM | Admit: 2015-02-15 | Discharge: 2015-02-15 | Disposition: A | Discharge status: Home / Self Care | Payer: Medicare Other | Attending: Student | Admitting: Student

## 2015-02-15 DIAGNOSIS — S72034A Nondisplaced midcervical fracture of right femur, initial encounter for closed fracture: Secondary | ICD-10-CM | POA: Diagnosis not present

## 2015-02-15 DIAGNOSIS — Y9289 Other specified places as the place of occurrence of the external cause: Secondary | ICD-10-CM | POA: Diagnosis not present

## 2015-02-15 DIAGNOSIS — Z7982 Long term (current) use of aspirin: Secondary | ICD-10-CM | POA: Diagnosis not present

## 2015-02-15 DIAGNOSIS — W19XXXA Unspecified fall, initial encounter: Secondary | ICD-10-CM

## 2015-02-15 DIAGNOSIS — Z79899 Other long term (current) drug therapy: Secondary | ICD-10-CM | POA: Diagnosis not present

## 2015-02-15 DIAGNOSIS — Y9389 Activity, other specified: Secondary | ICD-10-CM | POA: Diagnosis not present

## 2015-02-15 DIAGNOSIS — S0993XA Unspecified injury of face, initial encounter: Secondary | ICD-10-CM | POA: Diagnosis present

## 2015-02-15 DIAGNOSIS — Y998 Other external cause status: Secondary | ICD-10-CM | POA: Insufficient documentation

## 2015-02-15 DIAGNOSIS — S0990XA Unspecified injury of head, initial encounter: Secondary | ICD-10-CM | POA: Insufficient documentation

## 2015-02-15 DIAGNOSIS — Z7952 Long term (current) use of systemic steroids: Secondary | ICD-10-CM | POA: Diagnosis not present

## 2015-02-15 DIAGNOSIS — S0181XA Laceration without foreign body of other part of head, initial encounter: Secondary | ICD-10-CM | POA: Insufficient documentation

## 2015-02-15 DIAGNOSIS — I10 Essential (primary) hypertension: Secondary | ICD-10-CM | POA: Diagnosis not present

## 2015-02-15 DIAGNOSIS — S72001A Fracture of unspecified part of neck of right femur, initial encounter for closed fracture: Secondary | ICD-10-CM

## 2015-02-15 DIAGNOSIS — W050XXA Fall from non-moving wheelchair, initial encounter: Secondary | ICD-10-CM | POA: Diagnosis not present

## 2015-02-15 MED ORDER — LIDOCAINE-EPINEPHRINE (PF) 1 %-1:200000 IJ SOLN
INTRAMUSCULAR | Status: AC
  Administered 2015-02-15: 15:00:00
  Filled 2015-02-15: qty 30

## 2015-02-15 MED ORDER — ACETAMINOPHEN 500 MG PO TABS: 500.0000 mg | ORAL_TABLET | ORAL | Status: DC

## 2015-02-15 NOTE — ED Provider Notes (Signed)
[facility] Emergency Department Provider Note  ____________________________________________  Time seen: Approximately 12:46 PM  I have reviewed the triage vital signs and the nursing notes.   HISTORY  Chief Complaint Fall  Caveat- History and physical and ROS are limited secondary to the patient's inability to cooperate, nonverbal state, inability to follow commands. All information obtained from staff at [facility] and the patient's son who is at bedside.   HPI BRESHAE BELCHER is a 81 y.o. female with past medical history significant for CHF hypothyroidism, dementia, prior CVA, frequent falls, hypertension, nonverbal completely care dependent who presents for evaluation of witnessed fall at [facility]. According to staff at [facility], the patient was sitting in her wheelchair and was attempting to lean forward when she fell, hitting her head. She did not lose consciousness. This happens to her often. She has otherwise been in her usual state of health without illness including no cough, vomiting, diarrhea, fevers or chills. Son at bedside reports that previously she had lap restraints in the wheelchair to prevent this from happening however the state deemed this inhumane and they were removed.   Past Medical History  Diagnosis Date  . CHF (congestive heart failure)   . GERD (gastroesophageal reflux disease)   . Insomnia   . Hypothyroidism   . Hypertension   . Dementia     There are no active problems to display for this patient.   History reviewed. No pertinent past surgical history.  Current Outpatient Rx  Name  Route  Sig  Dispense  Refill  . acetaminophen (TYLENOL) 325 MG tablet   Oral   Take 650 mg by mouth every 4 (four) hours as needed.         Marland Kitchen aspirin 81 MG EC tablet   Oral   Take 81 mg by mouth daily. Swallow whole.         . Cyanocobalamin 1000 MCG/ML KIT   Injection   Inject 1,000 mcg as directed daily. Inject 1 ml  intramuscularly one time a day starting on the 15th and ending on the 15th         . divalproex (DEPAKOTE SPRINKLE) 125 MG capsule   Oral   Take 250 mg by mouth 2 (two) times daily.         . famotidine (PEPCID) 20 MG tablet   Oral   Take 20 mg by mouth at bedtime.         . furosemide (LASIX) 20 MG tablet   Oral   Take 20 mg by mouth 2 (two) times daily.         Marland Kitchen gabapentin (NEURONTIN) 100 MG capsule   Oral   Take 200 mg by mouth at bedtime.         . hydrocortisone (ANUSOL-HC) 2.5 % rectal cream   Rectal   Place 1 application rectally 2 (two) times daily.         Marland Kitchen levothyroxine (SYNTHROID, LEVOTHROID) 100 MCG tablet   Oral   Take 100 mcg by mouth daily before breakfast.         . loratadine (CLARITIN) 10 MG tablet   Oral   Take 10 mg by mouth daily.         Marland Kitchen LORazepam (ATIVAN) 0.5 MG tablet   Oral   Take 0.25 mg by mouth every 8 (eight) hours as needed for anxiety.         . magnesium hydroxide (MILK OF MAGNESIA) 400 MG/5ML suspension  Oral   Take 30 mLs by mouth daily as needed for mild constipation.         . metoprolol succinate (TOPROL-XL) 25 MG 24 hr tablet   Oral   Take 25 mg by mouth daily.         . mineral oil liquid   Oral   Take 5 mLs by mouth daily.         . montelukast (SINGULAIR) 10 MG tablet   Oral   Take 10 mg by mouth daily.         . potassium chloride (KLOR-CON) 20 MEQ packet   Oral   Take 20 mEq by mouth 2 (two) times daily.         . traZODone (DESYREL) 50 MG tablet   Oral   Take 50 mg by mouth at bedtime.           Allergies Review of patient's allergies indicates no known allergies.  Family History  Problem Relation Age of Onset  . Family history unknown: Yes    Social History Social History  Substance Use Topics  . Smoking status: Unknown If Ever Smoked  . Smokeless tobacco: None  . Alcohol Use: None    Review of Systems Constitutional: No fever/chills Respiratory: Denies  shortness of breath. Gastrointestinal: No abdominal pain.  No nausea, no vomiting.  No diarrhea.  Skin: Negative for rash.  Caveat- History and physical and ROS are limited secondary to the patient's inability to cooperate, nonverbal state, inability to follow commands. All information obtained from staff at [facility] and the patient's son who is at bedside.  ____________________________________________   PHYSICAL EXAM:  VITAL SIGNS: ED Triage Vitals  Enc Vitals Group     BP 02/15/15 1227 157/77 mmHg     Pulse Rate 02/15/15 1227 88     Resp 02/15/15 1227 20     Temp 02/15/15 1227 98.5 F (36.9 C)     Temp Source 02/15/15 1227 Axillary     SpO2 02/15/15 1227 100 %     Weight 02/15/15 1227 128 lb 8.4 oz (58.299 kg)     Height 02/15/15 1227 5' 2"  (1.575 m)     Head Cir --      Peak Flow --      Pain Score --      Pain Loc --      Pain Edu? --      Excl. in [place]? --     Constitutional: Awake and alert, nonverbal but does moan in pain, does not follow commands Eyes: Conjunctivae are normal. PERRL. extraocular movements appear intact. ENT Head: 2 cm stellate, gaping laceration at the top of the forehead associated with the hairline. 1 cm linear laceration to the left of the lateral canthus but does not appear to involve the canthus. Mouth/Throat: Mucous membranes are moist. Neck: No stridor. C-collar applied in the emergency department. No midline tenderness to palpation. Cardiovascular: Normal rate, regular rhythm. Normal and symmetric distal pulses are present in all extremities. No murmurs, rubs, or gallops. Respiratory: Normal respiratory effort without tachypnea nor retractions. Breath sounds are clear and equal bilaterally. No wheezes/rales/rhonchi. Gastrointestinal: Soft and nontender. No distention.  There is no CVA tenderness. Genitourinary: Deferred Musculoskeletal: No edema or effusion of the lower extremities. Chronic contracture with poor movement of the right  upper extremity. Pelvis stable to rock and compression. She does have the right knee in full flexion and seems to have pain when I try to straighten the  leg but son reports this is a chronic finding due to contracture. 2+ DP pulses bilaterally. Neurologic: Nonverbal, does not follow commands, does make eye contact purposefully when i am speaking to her, limited spontaneous movement of the RUE which is chronic since CVA, is to move the left upper extremity and bilateral lower extremities equally but does not follow commands/participate in formal neurological testing. Skin: Skin is warm, dry. No rash noted. Psychiatric: Mood and affect are consistent with her chronic medical conditions. ____________________________________________   LABS (all labs ordered are listed, but only abnormal results are displayed)  Labs Reviewed - No data to display ____________________________________________  EKG  none ____________________________________________  RADIOLOGY  CT head/face/c-spine IMPRESSION: 1. Stable age related cerebral atrophy, ventriculomegaly and periventricular white matter disease. 2. No acute intracranial findings or skull fracture. Small scalp hematomas are noted. 3. No acute facial bone fractures. 4. Degenerative cervical spondylosis with severe bilateral facet disease. 5. Bilateral C1 fractures are again demonstrated with some healing changes on the right side. No new/acute cervical spine fracture.   CXR IMPRESSION: No acute cardiopulmonary findings and intact bony thorax.   Pelvis xray IMPRESSION: 1. Nondisplaced transcervical right femoral neck fracture. 2. Moderate degenerative changes of osteoarthritis in the hip joints bilaterally.  Right knee xray IMPRESSION: No acute bony findings. ____________________________________________   PROCEDURES  Procedure(s) performed:LACERATION REPAIR Performed by: Joanne Gavel Authorized by: Loura Pardon A Consent:  Verbal consent obtained. Risks and benefits: risks, benefits and alternatives were discussed Consent given by: patient Patient identity confirmed: provided demographic data Prepped and Draped in normal sterile fashion Wound explored  Laceration Location: forehead  Laceration Length: 2cm  No Foreign Bodies seen or palpated  Anesthesia: local infiltration  Local anesthetic: lidocaine 1% with epinephrine  Anesthetic total: 5 ml  Irrigation method: syringe Amount of cleaning: standard  Skin closure: 4-0 prolene  Number of sutures: 4  Technique: simple interrupted  Patient tolerance: Patient tolerated the procedure well with no immediate complications.  LACERATION REPAIR Performed by: Joanne Gavel Authorized by: Loura Pardon A Consent: Verbal consent obtained. Risks and benefits: risks, benefits and alternatives were discussed Consent given by: patient Patient identity confirmed: provided demographic data Prepped and Draped in normal sterile fashion Wound explored  Laceration Location: lateral to left ee  Laceration Length: 1 cm  No Foreign Bodies seen or palpated  Anesthesia: local infiltration  Local anesthetic: lidocaine 1% with epinephrine  Anesthetic total: 59m  Irrigation method: syringe Amount of cleaning: standard  Skin closure: 6-0 prolene  Number of sutures: 2  Technique: simple interrupted  Patient tolerance: Patient tolerated the procedure well with no immediate complications.  Critical Care performed: No  ____________________________________________   INITIAL IMPRESSION / ASSESSMENT AND PLAN / ED COURSE  Pertinent labs & imaging results that were available during my care of the patient were reviewed by me and considered in my medical decision making (see chart for details).  Surena HAZELIE NOGUERAis a 81y.o. female with past medical history significant for CHF hypothyroidism, dementia, prior CVA, frequent falls, hypertension, nonverbal  completely care dependent who presents for evaluation of witnessed fall at [facility] According to her son, she is at her baseline in terms of mental status. CT head, C-spine and maxillofacial obtained which are notable for old C-spine fractures which she sustained during her last fall, nothing acute. Chest x-ray negative. Pelvis film was notable for nondisplaced right transcervical femoral neck fracture. She is neurovascularly intact in the right lower extremity though she does  have chronic contracture. I discussed this with her son, HCPOA, who would not want her to have an operation. I discussed the case with Dr. Roland Rack of orthopedic surgery who has reviewed the films. He reports that as the patient is nonweightbearing at baseline, wheelchair-bound, is not even able to stand and pivot or transfer weight at baseline, and given her multiple comorbidities, he does not recommend operative intervention. We'll DC with scheduled Tylenol for pain, return precautions, and orthopedics follow-up. The patient's son who is at bedside is comfortable with the discharge plan. Her lacerations have been repaired. Her Tdap up-to-date. Discussed case with [facility] and they are also comfortable with the plan. ____________________________________________   FINAL CLINICAL IMPRESSION(S) / ED DIAGNOSES  Final diagnoses:  Fall, initial encounter  Facial laceration, initial encounter  Closed right hip fracture, initial encounter      Joanne Gavel, MD 02/15/15 1600

## 2015-02-15 NOTE — ED Notes (Signed)
Irrigated and cleaned both lacerations on face.Small amount of bleeding to forehead noted.  Pt waiting on CT results at this time. Family remains at bedside.

## 2015-02-15 NOTE — ED Notes (Signed)
EMS states pt is from [facility] and fell out of wheelchair. Pt has laceration noted under left eye and to forehead. Bleeding controlled at this time. Pt has history of Dementia and is unable to answer questions. Pt moaning and yelling out.

## 2015-02-15 NOTE — ED Notes (Signed)
Pts wound continues to bleed at this time. Dressing removed and new dressing applied. And wound cleaned again.

## 2015-02-15 NOTE — ED Notes (Signed)
Patient transported to CT and XR 

## 2015-02-20 ENCOUNTER — Encounter: Payer: Self-pay | Admitting: *Deleted

## 2015-02-20 ENCOUNTER — Inpatient Hospital Stay
Admission: EM | Admit: 2015-02-20 | Discharge: 2015-03-20 | DRG: 189 | Disposition: E | Payer: Medicare Other | Attending: Internal Medicine | Admitting: Internal Medicine

## 2015-02-20 ENCOUNTER — Emergency Department: Payer: Medicare Other

## 2015-02-20 DIAGNOSIS — E876 Hypokalemia: Secondary | ICD-10-CM | POA: Diagnosis present

## 2015-02-20 DIAGNOSIS — K219 Gastro-esophageal reflux disease without esophagitis: Secondary | ICD-10-CM | POA: Diagnosis present

## 2015-02-20 DIAGNOSIS — Z993 Dependence on wheelchair: Secondary | ICD-10-CM | POA: Diagnosis not present

## 2015-02-20 DIAGNOSIS — I959 Hypotension, unspecified: Secondary | ICD-10-CM

## 2015-02-20 DIAGNOSIS — E039 Hypothyroidism, unspecified: Secondary | ICD-10-CM | POA: Diagnosis present

## 2015-02-20 DIAGNOSIS — I10 Essential (primary) hypertension: Secondary | ICD-10-CM | POA: Diagnosis present

## 2015-02-20 DIAGNOSIS — G4700 Insomnia, unspecified: Secondary | ICD-10-CM | POA: Diagnosis present

## 2015-02-20 DIAGNOSIS — S72001A Fracture of unspecified part of neck of right femur, initial encounter for closed fracture: Secondary | ICD-10-CM | POA: Diagnosis present

## 2015-02-20 DIAGNOSIS — R000 Tachycardia, unspecified: Secondary | ICD-10-CM | POA: Diagnosis present

## 2015-02-20 DIAGNOSIS — F0150 Vascular dementia without behavioral disturbance: Secondary | ICD-10-CM | POA: Diagnosis present

## 2015-02-20 DIAGNOSIS — Z7951 Long term (current) use of inhaled steroids: Secondary | ICD-10-CM

## 2015-02-20 DIAGNOSIS — Z515 Encounter for palliative care: Secondary | ICD-10-CM | POA: Diagnosis present

## 2015-02-20 DIAGNOSIS — I69398 Other sequelae of cerebral infarction: Secondary | ICD-10-CM

## 2015-02-20 DIAGNOSIS — E860 Dehydration: Secondary | ICD-10-CM

## 2015-02-20 DIAGNOSIS — I501 Left ventricular failure: Secondary | ICD-10-CM | POA: Diagnosis present

## 2015-02-20 DIAGNOSIS — J9601 Acute respiratory failure with hypoxia: Principal | ICD-10-CM

## 2015-02-20 DIAGNOSIS — Z9181 History of falling: Secondary | ICD-10-CM | POA: Diagnosis not present

## 2015-02-20 DIAGNOSIS — L8990 Pressure ulcer of unspecified site, unspecified stage: Secondary | ICD-10-CM | POA: Diagnosis present

## 2015-02-20 DIAGNOSIS — J9690 Respiratory failure, unspecified, unspecified whether with hypoxia or hypercapnia: Secondary | ICD-10-CM

## 2015-02-20 DIAGNOSIS — R0603 Acute respiratory distress: Secondary | ICD-10-CM | POA: Diagnosis present

## 2015-02-20 DIAGNOSIS — E43 Unspecified severe protein-calorie malnutrition: Secondary | ICD-10-CM | POA: Diagnosis present

## 2015-02-20 DIAGNOSIS — I5023 Acute on chronic systolic (congestive) heart failure: Secondary | ICD-10-CM | POA: Diagnosis present

## 2015-02-20 DIAGNOSIS — S72001D Fracture of unspecified part of neck of right femur, subsequent encounter for closed fracture with routine healing: Secondary | ICD-10-CM

## 2015-02-20 DIAGNOSIS — N179 Acute kidney failure, unspecified: Secondary | ICD-10-CM | POA: Diagnosis not present

## 2015-02-20 DIAGNOSIS — E870 Hyperosmolality and hypernatremia: Secondary | ICD-10-CM | POA: Diagnosis present

## 2015-02-20 DIAGNOSIS — R296 Repeated falls: Secondary | ICD-10-CM | POA: Diagnosis present

## 2015-02-20 DIAGNOSIS — J810 Acute pulmonary edema: Secondary | ICD-10-CM

## 2015-02-20 DIAGNOSIS — Z66 Do not resuscitate: Secondary | ICD-10-CM | POA: Diagnosis present

## 2015-02-20 DIAGNOSIS — I5021 Acute systolic (congestive) heart failure: Secondary | ICD-10-CM | POA: Diagnosis not present

## 2015-02-20 DIAGNOSIS — R400 Somnolence: Secondary | ICD-10-CM | POA: Diagnosis not present

## 2015-02-20 DIAGNOSIS — I69351 Hemiplegia and hemiparesis following cerebral infarction affecting right dominant side: Secondary | ICD-10-CM | POA: Diagnosis not present

## 2015-02-20 DIAGNOSIS — E46 Unspecified protein-calorie malnutrition: Secondary | ICD-10-CM | POA: Diagnosis present

## 2015-02-20 DIAGNOSIS — S0083XA Contusion of other part of head, initial encounter: Secondary | ICD-10-CM | POA: Diagnosis present

## 2015-02-20 DIAGNOSIS — Z90710 Acquired absence of both cervix and uterus: Secondary | ICD-10-CM | POA: Diagnosis not present

## 2015-02-20 DIAGNOSIS — E785 Hyperlipidemia, unspecified: Secondary | ICD-10-CM | POA: Diagnosis present

## 2015-02-20 DIAGNOSIS — W19XXXA Unspecified fall, initial encounter: Secondary | ICD-10-CM | POA: Diagnosis present

## 2015-02-20 DIAGNOSIS — R531 Weakness: Secondary | ICD-10-CM

## 2015-02-20 HISTORY — DX: Cerebral infarction, unspecified: I63.9

## 2015-02-20 LAB — CBC WITH DIFFERENTIAL/PLATELET
Basophils Absolute: 0 10*3/uL (ref 0–0.1)
Basophils Relative: 0 %
Eosinophils Absolute: 0 10*3/uL (ref 0–0.7)
Eosinophils Relative: 0 %
HCT: 42.6 % (ref 35.0–47.0)
HEMOGLOBIN: 14.1 g/dL (ref 12.0–16.0)
LYMPHS ABS: 0.9 10*3/uL — AB (ref 1.0–3.6)
LYMPHS PCT: 14 %
MCH: 28.7 pg (ref 26.0–34.0)
MCHC: 33 g/dL (ref 32.0–36.0)
MCV: 86.8 fL (ref 80.0–100.0)
Monocytes Absolute: 1.1 10*3/uL — ABNORMAL HIGH (ref 0.2–0.9)
Monocytes Relative: 16 %
NEUTROS ABS: 4.7 10*3/uL (ref 1.4–6.5)
NEUTROS PCT: 70 %
Platelets: 275 10*3/uL (ref 150–440)
RBC: 4.91 MIL/uL (ref 3.80–5.20)
RDW: 15.7 % — ABNORMAL HIGH (ref 11.5–14.5)
WBC: 6.7 10*3/uL (ref 3.6–11.0)

## 2015-02-20 LAB — BASIC METABOLIC PANEL
Anion gap: 14 (ref 5–15)
BUN: 36 mg/dL — AB (ref 6–20)
CHLORIDE: 108 mmol/L (ref 101–111)
CO2: 28 mmol/L (ref 22–32)
Calcium: 9.4 mg/dL (ref 8.9–10.3)
Creatinine, Ser: 1.39 mg/dL — ABNORMAL HIGH (ref 0.44–1.00)
GFR calc Af Amer: 37 mL/min — ABNORMAL LOW (ref >60)
GFR calc non Af Amer: 32 mL/min — ABNORMAL LOW (ref >60)
Glucose, Bld: 150 mg/dL — ABNORMAL HIGH (ref 65–99)
POTASSIUM: 3.2 mmol/L — AB (ref 3.5–5.1)
SODIUM: 150 mmol/L — AB (ref 135–145)

## 2015-02-20 LAB — TROPONIN I: Troponin I: <0.03 ng/mL (ref <0.031)

## 2015-02-20 LAB — BRAIN NATRIURETIC PEPTIDE: B NATRIURETIC PEPTIDE 5: 171 pg/mL — AB (ref 0.0–100.0)

## 2015-02-20 MED ORDER — SODIUM CHLORIDE 0.9 % IV BOLUS (SEPSIS): 500.0000 mL | Freq: Once | INTRAVENOUS | Status: DC

## 2015-02-20 MED ORDER — ENOXAPARIN SODIUM 30 MG/0.3ML ~~LOC~~ SOLN
30.0000 mg | SUBCUTANEOUS | Status: DC
  Administered 2015-02-20: 30 mg via SUBCUTANEOUS
  Filled 2015-02-20: qty 0.3

## 2015-02-20 MED ORDER — ACETAMINOPHEN 325 MG PO TABS: 650.0000 mg | ORAL_TABLET | ORAL | Status: DC | PRN

## 2015-02-20 MED ORDER — GABAPENTIN 100 MG PO CAPS
100.0000 mg | ORAL_CAPSULE | Freq: Every day | ORAL | Status: DC
  Administered 2015-02-20: 100 mg via ORAL
  Filled 2015-02-20: qty 1

## 2015-02-20 MED ORDER — SODIUM CHLORIDE 0.9 % IJ SOLN
3.0000 mL | Freq: Two times a day (BID) | INTRAMUSCULAR | Status: DC
  Administered 2015-02-20: 23:00:00 3 mL via INTRAVENOUS

## 2015-02-20 MED ORDER — ACETAMINOPHEN 650 MG RE SUPP: 650.0000 mg | Freq: Four times a day (QID) | RECTAL | Status: DC | PRN

## 2015-02-20 MED ORDER — DIVALPROEX SODIUM 125 MG PO CSDR
250.0000 mg | DELAYED_RELEASE_CAPSULE | Freq: Two times a day (BID) | ORAL | Status: DC
  Administered 2015-02-20: 250 mg via ORAL
  Filled 2015-02-20: qty 2

## 2015-02-20 MED ORDER — DOCUSATE SODIUM 100 MG PO CAPS
100.0000 mg | ORAL_CAPSULE | Freq: Two times a day (BID) | ORAL | Status: DC
  Administered 2015-02-20: 23:00:00 100 mg via ORAL
  Filled 2015-02-20: qty 1

## 2015-02-20 MED ORDER — LEVOFLOXACIN IN D5W 500 MG/100ML IV SOLN
500.0000 mg | INTRAVENOUS | Status: DC
  Administered 2015-02-20: 500 mg via INTRAVENOUS
  Filled 2015-02-20: qty 100

## 2015-02-20 MED ORDER — METOPROLOL SUCCINATE ER 25 MG PO TB24
25.0000 mg | ORAL_TABLET | Freq: Every day | ORAL | Status: DC
  Administered 2015-02-20: 25 mg via ORAL
  Filled 2015-02-20: qty 1

## 2015-02-20 MED ORDER — POTASSIUM CHLORIDE 20 MEQ PO PACK
40.0000 meq | PACK | Freq: Once | ORAL | Status: AC
  Administered 2015-02-20: 40 meq via ORAL
  Filled 2015-02-20: qty 2

## 2015-02-20 MED ORDER — MINERAL OIL PO OIL
5.0000 mL | TOPICAL_OIL | Freq: Every evening | ORAL | Status: DC | PRN
  Filled 2015-02-20: qty 30

## 2015-02-20 MED ORDER — MONTELUKAST SODIUM 10 MG PO TABS
10.0000 mg | ORAL_TABLET | Freq: Every day | ORAL | Status: DC
  Administered 2015-02-20: 10 mg via ORAL
  Filled 2015-02-20: qty 1

## 2015-02-20 MED ORDER — SODIUM CHLORIDE 0.9 % IJ SOLN
3.0000 mL | Freq: Two times a day (BID) | INTRAMUSCULAR | Status: DC
  Administered 2015-02-20: 3 mL via INTRAVENOUS

## 2015-02-20 MED ORDER — FUROSEMIDE 10 MG/ML IJ SOLN
20.0000 mg | Freq: Once | INTRAMUSCULAR | Status: AC
  Administered 2015-02-20: 20 mg via INTRAVENOUS
  Filled 2015-02-20: qty 4

## 2015-02-20 MED ORDER — ASPIRIN EC 81 MG PO TBEC
81.0000 mg | DELAYED_RELEASE_TABLET | Freq: Every day | ORAL | Status: DC
  Administered 2015-02-20: 23:00:00 81 mg via ORAL
  Filled 2015-02-20: qty 1

## 2015-02-20 MED ORDER — ACETAMINOPHEN 500 MG PO TABS
500.0000 mg | ORAL_TABLET | ORAL | Status: DC
  Administered 2015-02-20 – 2015-02-21 (×2): 500 mg via ORAL
  Filled 2015-02-20 (×2): qty 1

## 2015-02-20 MED ORDER — MAGNESIUM HYDROXIDE 400 MG/5ML PO SUSP: 30.0000 mL | Freq: Every day | ORAL | Status: DC | PRN

## 2015-02-20 MED ORDER — POTASSIUM CHLORIDE 20 MEQ PO PACK
20.0000 meq | PACK | Freq: Two times a day (BID) | ORAL | Status: DC
  Administered 2015-02-20: 20 meq via ORAL
  Filled 2015-02-20: qty 1

## 2015-02-20 MED ORDER — ONDANSETRON HCL 4 MG PO TABS: 4.0000 mg | ORAL_TABLET | Freq: Four times a day (QID) | ORAL | Status: DC | PRN

## 2015-02-20 MED ORDER — FAMOTIDINE 20 MG PO TABS
20.0000 mg | ORAL_TABLET | Freq: Every day | ORAL | Status: DC
  Administered 2015-02-20: 23:00:00 20 mg via ORAL
  Filled 2015-02-20: qty 1

## 2015-02-20 MED ORDER — ACETAMINOPHEN 325 MG PO TABS: 650.0000 mg | ORAL_TABLET | Freq: Four times a day (QID) | ORAL | Status: DC | PRN

## 2015-02-20 MED ORDER — ONDANSETRON HCL 4 MG/2ML IJ SOLN: 4.0000 mg | Freq: Four times a day (QID) | INTRAMUSCULAR | Status: DC | PRN

## 2015-02-20 MED ORDER — SODIUM CHLORIDE 0.9 % IV SOLN: 250.0000 mL | INTRAVENOUS | Status: DC | PRN

## 2015-02-20 MED ORDER — SODIUM CHLORIDE 0.9 % IJ SOLN: 3.0000 mL | INTRAMUSCULAR | Status: DC | PRN

## 2015-02-20 MED ORDER — HYDROCORTISONE 2.5 % RE CREA
1.0000 "application " | TOPICAL_CREAM | Freq: Two times a day (BID) | RECTAL | Status: DC
  Administered 2015-02-20: 1 via RECTAL
  Filled 2015-02-20: qty 28.35

## 2015-02-20 MED ORDER — LORAZEPAM 0.5 MG PO TABS: 0.2500 mg | ORAL_TABLET | Freq: Three times a day (TID) | ORAL | Status: DC | PRN

## 2015-02-20 MED ORDER — CYANOCOBALAMIN 1000 MCG/ML IJ SOLN
1000.0000 ug | INTRAMUSCULAR | Status: DC
  Administered 2015-02-20: 1000 ug via INTRAMUSCULAR
  Filled 2015-02-20: qty 1

## 2015-02-20 MED ORDER — LORATADINE 10 MG PO TABS
10.0000 mg | ORAL_TABLET | Freq: Every day | ORAL | Status: DC
  Administered 2015-02-20: 10 mg via ORAL
  Filled 2015-02-20: qty 1

## 2015-02-20 MED ORDER — FUROSEMIDE 20 MG PO TABS: 20.0000 mg | ORAL_TABLET | Freq: Two times a day (BID) | ORAL | Status: DC

## 2015-02-20 MED ORDER — LEVOTHYROXINE SODIUM 100 MCG PO TABS: 100.0000 ug | ORAL_TABLET | Freq: Every day | ORAL | Status: DC

## 2015-02-20 NOTE — ED Notes (Signed)
Attempted to give patient applesauce. Patient opened her mouth, took applesauce off spoon and swallowed appropriately. We did this for approximately half of the container. She tolerated it well. Floor notified.

## 2015-02-20 NOTE — ED Notes (Signed)
Respiratory paged to come and place patient on bi-pap.

## 2015-02-20 NOTE — ED Notes (Signed)
Hospitalist at bedside 

## 2015-02-20 NOTE — H&P (Signed)
[facility] [place] at [facility]   PATIENT NAME: Abigail Mcclure    MR#:  [id number]  DATE OF BIRTH:  01/18/1922  DATE OF ADMISSION:  03/11/2015  PRIMARY CARE PHYSICIAN: Lorie Phenix, MD   REQUESTING/REFERRING PHYSICIAN: Dr. Carollee Massed  CHIEF COMPLAINT:   Shortness of breath HISTORY OF PRESENT ILLNESS:  Nyha Pado  is a 81 y.o. female with a known history of chronic dementia, nonverbal, wheelchair bound with recent history of fall leading to right hip fracture , surgery was not done as patient is not a surgical candidate and contusion on her face, is sent over from [facility] for shortness of breath and hypoxia with a pulse ox at 88% on room air. Patient was placed on 4 L of oxygen but still she was hypoxic at 88% during my examination. Son and son's friend were at bedside. Unable to get any history from the patient as she is nonverbal.  PAST MEDICAL HISTORY:   Past Medical History  Diagnosis Date  . CHF (congestive heart failure) (HCC)   . GERD (gastroesophageal reflux disease)   . Insomnia   . Hypothyroidism   . Hypertension   . Dementia   . Stroke [facility])     PAST SURGICAL HISTOIRY:   Past Surgical History  Procedure Laterality Date  . Abdominal hysterectomy      SOCIAL HISTORY:   Social History  Substance Use Topics  . Smoking status: Never Smoker   . Smokeless tobacco: Never Used  . Alcohol Use: No    FAMILY HISTORY:   Family History  Problem Relation Age of Onset  . Family history unknown: Yes    DRUG ALLERGIES:  No Known Allergies  REVIEW OF SYSTEMS:  Review of systems is unobtainable as the patient is with chronic dementia and nonverbal  MEDICATIONS AT HOME:   Prior to Admission medications   Medication Sig Start Date End Date Taking? Authorizing Provider  acetaminophen (TYLENOL) 325 MG tablet Take 650 mg by mouth every 4 (four) hours as needed for mild pain.   Yes Historical Provider, MD  acetaminophen (TYLENOL) 500 MG  tablet Take 1 tablet (500 mg total) by mouth every 4 (four) hours. 02/15/15 02/15/16 Yes Gayla Doss, MD  aspirin EC 81 MG tablet Take 81 mg by mouth daily.   Yes Historical Provider, MD  cyanocobalamin (,VITAMIN B-12,) 1000 MCG/ML injection Inject 1,000 mcg into the muscle every 30 (thirty) days. Pt uses on the 15th of every month.   Yes Historical Provider, MD  divalproex (DEPAKOTE SPRINKLE) 125 MG capsule Take 250 mg by mouth 2 (two) times daily.   Yes Historical Provider, MD  famotidine (PEPCID) 20 MG tablet Take 20 mg by mouth at bedtime.   Yes Historical Provider, MD  furosemide (LASIX) 20 MG tablet Take 20 mg by mouth 2 (two) times daily.   Yes Historical Provider, MD  gabapentin (NEURONTIN) 100 MG capsule Take 100 mg by mouth at bedtime.    Yes Historical Provider, MD  hydrocortisone (ANUSOL-HC) 2.5 % rectal cream Place 1 application rectally 2 (two) times daily.   Yes Historical Provider, MD  levothyroxine (SYNTHROID, LEVOTHROID) 100 MCG tablet Take 100 mcg by mouth daily before breakfast.   Yes Historical Provider, MD  loratadine (CLARITIN) 10 MG tablet Take 10 mg by mouth daily.   Yes Historical Provider, MD  LORazepam (ATIVAN) 0.5 MG tablet Take 0.25 mg by mouth every 8 (eight) hours as needed for anxiety.   Yes Historical Provider, MD  magnesium hydroxide (MILK OF MAGNESIA) 400 MG/5ML suspension Take 30 mLs by mouth daily as needed for mild constipation.   Yes Historical Provider, MD  metoprolol succinate (TOPROL-XL) 25 MG 24 hr tablet Take 25 mg by mouth daily.   Yes Historical Provider, MD  mineral oil liquid Take 5-10 mLs by mouth at bedtime as needed for mild constipation.    Yes Historical Provider, MD  montelukast (SINGULAIR) 10 MG tablet Take 10 mg by mouth daily.   Yes Historical Provider, MD  potassium chloride (KLOR-CON) 20 MEQ packet Take 20 mEq by mouth 2 (two) times daily.   Yes Historical Provider, MD  traZODone (DESYREL) 50 MG tablet Take 50 mg by mouth at bedtime.   Yes  Historical Provider, MD      VITAL SIGNS:  Blood pressure 135/68, pulse 102, temperature 99.2 F (37.3 C), temperature source Oral, resp. rate 16, SpO2 92 %.  PHYSICAL EXAMINATION:  GENERAL:  81 y.o.-year-old patient lying in the bed with acute distress.  EYES: Pupils equal, round, reactive to light and accommodation. No scleral icterus. Forehead laceration is healing well but still bruised HEENT: Head atraumatic, normocephalic. Oropharynx and nasopharynx clear.  NECK:  Supple, no jugular venous distention. No thyroid enlargement, no tenderness.  LUNGS: Shallow breath sounds bilaterally, moderate air entry, no wheezing, positive rales,rhonchi , no crepitation. Positive use of accessory muscles of respiration.  CARDIOVASCULAR: S1, S2 normal. No murmurs, rubs, or gallops.  ABDOMEN: Soft, nontender, nondistended. Bowel sounds present. No organomegaly or mass.  EXTREMITIES: No pedal edema, cyanosis, or clubbing.  NEUROLOGIC: Patient is demented  PSYCHIATRIC: The patient is alert but disoriented from chronic dementia  SKIN: No obvious rash, forehead laceration is healing well but with bruising  LABORATORY PANEL:   CBC  Recent Labs Lab 01-Mar-2015 1719  WBC 6.7  HGB 14.1  HCT 42.6  PLT 275   ------------------------------------------------------------------------------------------------------------------  Chemistries   Recent Labs Lab 2015-03-01 1719  NA 150*  K 3.2*  CL 108  CO2 28  GLUCOSE 150*  BUN 36*  CREATININE 1.39*  CALCIUM 9.4   ------------------------------------------------------------------------------------------------------------------  Cardiac Enzymes  Recent Labs Lab March 01, 2015 1719  TROPONINI <0.03   ------------------------------------------------------------------------------------------------------------------  RADIOLOGY:  Dg Chest Port 1 View  03-01-2015   CLINICAL DATA:  Patient unable to communicate post recent fall.  EXAM: PORTABLE CHEST 1  VIEW  COMPARISON:  02/15/2015 and earlier  FINDINGS: Elevated left hemidiaphragm. The cardiac borders are partially obscured by this elevation. There is new perihilar and interstitial density, consistent with mild pulmonary edema. No pleural effusions are identified.  No pneumothorax. No acute fractures identified. Old right ninth rib fracture.  IMPRESSION: Findings consistent with early edema.   Electronically Signed   By: Norva Pavlov M.D.   On: 01-Mar-2015 17:30    EKG:   Orders placed or performed during the hospital encounter of 01-Mar-2015  . EKG 12-Lead  . EKG 12-Lead  . ED EKG  . ED EKG    IMPRESSION AND PLAN:  Ms. Begeman  is a 81 y.o. female with a known history of chronic dementia, nonverbal, wheelchair bound with recent history of fall leading to right hip fracture , surgery was not done as patient is not a surgical candidate and contusion on her face, is sent over from [facility] for shortness of breath and hypoxia with a pulse ox at 88% on room air. Patient was placed on 4 L of oxygen but still she was hypoxic at 88% during my examination. Son and  son's friend were at bedside. Unable to get any history from the patient as she is nonverbal.   1.Acute hypoxic respiratory failure secondary to pulmonary edema Patient is placed on BiPAP Lasix IV was given in the ED Monitor intake and output  2. Chronic history of dementia Patient is nonverbal, wheelchair bound at her baseline  3. Recent history of right hip fracture, surgery was not done as patient is not a surgical candidate Patient is wheelchair bound  4. Hypokalemia, might get worse with Lasix Will give potassium supplements and check magnesium Check BMP in a.m.  5. Chronic history of CVA with chronic right-sided weakness Currently wheelchair bound  6. Chronic history of hypertension  7. Hyperlipidemia  Son is considering DO NOT RESUSCITATE with comfort care if patient's clinical situation is not getting better  with BiPAP Palliative care consult is placed  All the records are reviewed and case discussed with ED provider. Management plans discussed with the patient, family and they are in agreement. Greater than 50% time was spent on face-to-face counseling and coordination of care  CODE STATUS: DO NOT RESUSCITATE, son is the healthcare power of attorney  TOTAL TIME TAKING CARE OF THIS PATIENT: 50 minutes.    Ramonita Lab M.D on 02/21/2015 at 10:04 PM  Between 7am to 6pm - Pager - [phone]  After 6pm go to [email or web address] - password [email or web address] [facility]  [facility]  Office  [phone]  CC: Primary care physician; Lorie Phenix, MD

## 2015-02-20 NOTE — ED Notes (Signed)
Per EMS pt from [facility] with c/o shortness of breath. Possible aspiration. O2 sats 88% on RA with gurgling. Pt nonverbal. Previous fall last week. Broken hip, not operated on.

## 2015-02-20 NOTE — ED Provider Notes (Signed)
[facility] Emergency Department Provider Note  ____________________________________________  Time seen: 1655  I have reviewed the triage vital signs and the nursing notes.  History from the patient's son.  The patient is non-verbal and not interactive and is unable to provide history. Review of systems is not possible.   HISTORY  Chief Complaint Shortness of Breath     HPI Abigail Mcclure is a 81 y.o. female who recently sustained a fall leading to a fractured right hip and contusions on her face.  It was chosen not to repair the right hip. She was shortly discharged on the hospital and is now at [facility]. She has been sent to the hospital today due to ongoing shortness of breath.  The patient has had low oxygen saturation levels down to 88% and has been noted to be gurgling.  The son reports that she has had falls and difficulties in the past but after a day or 2 always returns to her baseline cognition, however, this time she has not returned to her baseline cognition or communication.       Past Medical History  Diagnosis Date  . CHF (congestive heart failure) ([place])   . GERD (gastroesophageal reflux disease)   . Insomnia   . Hypothyroidism   . Hypertension   . Dementia     There are no active problems to display for this patient.   History reviewed. No pertinent past surgical history.  Current Outpatient Rx  Name  Route  Sig  Dispense  Refill  . acetaminophen (TYLENOL) 500 MG tablet   Oral   Take 1 tablet (500 mg total) by mouth every 4 (four) hours.   30 tablet   0   . aspirin 81 MG EC tablet   Oral   Take 81 mg by mouth daily. Swallow whole.         . Cyanocobalamin 1000 MCG/ML KIT   Injection   Inject 1,000 mcg as directed daily. Inject 1 ml intramuscularly one time a day starting on the 15th and ending on the 15th         . divalproex (DEPAKOTE SPRINKLE) 125 MG capsule   Oral   Take 250 mg by mouth 2 (two) times  daily.         . famotidine (PEPCID) 20 MG tablet   Oral   Take 20 mg by mouth at bedtime.         . furosemide (LASIX) 20 MG tablet   Oral   Take 20 mg by mouth 2 (two) times daily.         Marland Kitchen gabapentin (NEURONTIN) 100 MG capsule   Oral   Take 200 mg by mouth at bedtime.         . hydrocortisone (ANUSOL-HC) 2.5 % rectal cream   Rectal   Place 1 application rectally 2 (two) times daily.         Marland Kitchen levothyroxine (SYNTHROID, LEVOTHROID) 100 MCG tablet   Oral   Take 100 mcg by mouth daily before breakfast.         . loratadine (CLARITIN) 10 MG tablet   Oral   Take 10 mg by mouth daily.         Marland Kitchen LORazepam (ATIVAN) 0.5 MG tablet   Oral   Take 0.25 mg by mouth every 8 (eight) hours as needed for anxiety.         . magnesium hydroxide (MILK OF MAGNESIA) 400 MG/5ML suspension   Oral  Take 30 mLs by mouth daily as needed for mild constipation.         . metoprolol succinate (TOPROL-XL) 25 MG 24 hr tablet   Oral   Take 25 mg by mouth daily.         . mineral oil liquid   Oral   Take 5 mLs by mouth daily.         . montelukast (SINGULAIR) 10 MG tablet   Oral   Take 10 mg by mouth daily.         . potassium chloride (KLOR-CON) 20 MEQ packet   Oral   Take 20 mEq by mouth 2 (two) times daily.         . traZODone (DESYREL) 50 MG tablet   Oral   Take 50 mg by mouth at bedtime.           Allergies Review of patient's allergies indicates no known allergies.  Family History  Problem Relation Age of Onset  . Family history unknown: Yes    Social History Social History  Substance Use Topics  . Smoking status: Unknown If Ever Smoked  . Smokeless tobacco: None  . Alcohol Use: None    Review of Systems Unable to obtain a review of systems due to the patient's cognitive status and noncommunication. ____________________________________________   PHYSICAL EXAM:  VITAL SIGNS: ED Triage Vitals  Enc Vitals Group     BP --      Pulse --       Resp --      Temp --      Temp src --      SpO2 --      Weight --      Height --      Head Cir --      Peak Flow --      Pain Score --      Pain Loc --      Pain Edu? --      Excl. in [place]? --     Constitutional:  Thin, frail, elderly appearing 81 year old female with gurgling during respirations. She moans but is unable to interact or engage verbally. ENT   Head: There is a scabbed over area on her upper frontal area with underlying hematoma.  There is notable ecchymosis down through the left portion of the face with mild swelling near the left eye. There is also a healing laceration just lateral to the lateral canthus of the left eye.     Cardiovascular: Rate of 100-103, regular rhythm, no murmur noted Respiratory:  Patient is making a gurgling sound as she breathes and an out. This is notable when auscultating the lungs as well with bilateral rales and rhonchi.  Gastrointestinal: Soft and nontender. No distention.  Musculoskeletal: The right hip and knee are flexed. There is some tenderness on exam. The patient does not have full range of motion.  Neurologic:  Patient is moaning some but is not interactive. She has some mild spontaneous motion but does not appear to have full strength. Limited neurologic exam.  Skin:  Healing laceration lateral to the left eye and scabbed over area on the frontal bone. Ecchymosis to the left face. Psychiatric: Patient is not verbal or interactive at this time.  ____________________________________________    LABS (pertinent positives/negatives)  Labs Reviewed  CBC WITH DIFFERENTIAL/PLATELET - Abnormal; Notable for the following:    RDW 15.7 (*)    Lymphs Abs 0.9 (*)    Monocytes Absolute 1.1 (*)  All other components within normal limits  BASIC METABOLIC PANEL - Abnormal; Notable for the following:    Sodium 150 (*)    Potassium 3.2 (*)    Glucose, Bld 150 (*)    BUN 36 (*)    Creatinine, Ser 1.39 (*)    GFR calc non Af Amer 32  (*)    GFR calc Af Amer 37 (*)    All other components within normal limits  BRAIN NATRIURETIC PEPTIDE - Abnormal; Notable for the following:    B Natriuretic Peptide 171.0 (*)    All other components within normal limits  CULTURE, BLOOD (ROUTINE X 2)  CULTURE, BLOOD (ROUTINE X 2)  TROPONIN I     ____________________________________________   EKG  ED ECG REPORT I, Makynzi Eastland W, the attending physician, personally viewed and interpreted this ECG.   Date: 03/05/2015  EKG Time: 1650  Rate: 102  Rhythm: Sinus tachycardia  Axis: Normal  Intervals: Normal  ST&T Change: Nonspecific ST and T-wave abnormalities   ____________________________________________    RADIOLOGY  Chest x-ray FINDINGS: Elevated left hemidiaphragm. The cardiac borders are partially obscured by this elevation. There is new perihilar and interstitial density, consistent with mild pulmonary edema. No pleural effusions are identified.  No pneumothorax. No acute fractures identified. Old right ninth rib fracture.  IMPRESSION: Findings consistent with early edema.  ____________________________________________   PROCEDURES   ____________________________________________   INITIAL IMPRESSION / ASSESSMENT AND PLAN / ED COURSE  Pertinent labs & imaging results that were available during my care of the patient were reviewed by me and considered in my medical decision making (see chart for details).  81 year old female health with a recently fractured right hip that was chosen not to be repaired area and she now presents with notable respiratory difficulty, gurgling, and was notable edema on the chest x-ray.  Labs are currently pending. We will obtain blood cultures and initiate antibiotics as well, given her high-risk for pulmonary infection given her health care facility care and her poor respiratory status.  I've had a pleasant and extensive discussion with the patient's son who reports she is a  DO NOT RESUSCITATE patient. We also discussed to green of respiratory support and agreed that intubation was not appropriate for her.  ----------------------------------------- 6:16 PM on 03/16/2015 -----------------------------------------  Patient has an elevation in her sodium level to 150. Her creatinine is 3.2, there is slight elevation in BUN at 36, creatinine 1.39. We have treated her with a 500 mL bolus of normal saline.  I discussed the case with Dr. Margaretmary Eddy of the hospitalist service. She will evaluate the patient has been with the son further about comfort care measures for this patient. ____________________________________________   FINAL CLINICAL IMPRESSION(S) / ED DIAGNOSES  Final diagnoses:  Respiratory distress  Hypernatremia  General weakness  Dehydration  Hip fracture, right, closed, with routine healing, subsequent encounter      Ahmed Prima, MD 03/11/2015 1819

## 2015-02-21 ENCOUNTER — Inpatient Hospital Stay: Payer: Medicare Other

## 2015-02-21 DIAGNOSIS — Z9181 History of falling: Secondary | ICD-10-CM

## 2015-02-21 DIAGNOSIS — F0150 Vascular dementia without behavioral disturbance: Secondary | ICD-10-CM

## 2015-02-21 DIAGNOSIS — I959 Hypotension, unspecified: Secondary | ICD-10-CM

## 2015-02-21 DIAGNOSIS — Z66 Do not resuscitate: Secondary | ICD-10-CM

## 2015-02-21 DIAGNOSIS — L8990 Pressure ulcer of unspecified site, unspecified stage: Secondary | ICD-10-CM

## 2015-02-21 DIAGNOSIS — J9601 Acute respiratory failure with hypoxia: Principal | ICD-10-CM

## 2015-02-21 DIAGNOSIS — E870 Hyperosmolality and hypernatremia: Secondary | ICD-10-CM

## 2015-02-21 DIAGNOSIS — S72009D Fracture of unspecified part of neck of unspecified femur, subsequent encounter for closed fracture with routine healing: Secondary | ICD-10-CM

## 2015-02-21 DIAGNOSIS — Z515 Encounter for palliative care: Secondary | ICD-10-CM

## 2015-02-21 DIAGNOSIS — N179 Acute kidney failure, unspecified: Secondary | ICD-10-CM

## 2015-02-21 DIAGNOSIS — E46 Unspecified protein-calorie malnutrition: Secondary | ICD-10-CM

## 2015-02-21 DIAGNOSIS — I5021 Acute systolic (congestive) heart failure: Secondary | ICD-10-CM

## 2015-02-21 LAB — BASIC METABOLIC PANEL
ANION GAP: 12 (ref 5–15)
BUN: 45 mg/dL — ABNORMAL HIGH (ref 6–20)
CHLORIDE: 114 mmol/L — AB (ref 101–111)
CO2: 27 mmol/L (ref 22–32)
CREATININE: 2.14 mg/dL — AB (ref 0.44–1.00)
Calcium: 9.7 mg/dL (ref 8.9–10.3)
GFR calc non Af Amer: 19 mL/min — ABNORMAL LOW (ref >60)
GFR, EST AFRICAN AMERICAN: 22 mL/min — AB (ref >60)
Glucose, Bld: 111 mg/dL — ABNORMAL HIGH (ref 65–99)
POTASSIUM: 5.1 mmol/L (ref 3.5–5.1)
SODIUM: 153 mmol/L — AB (ref 135–145)

## 2015-02-21 LAB — CBC
HCT: 41.5 % (ref 35.0–47.0)
HEMOGLOBIN: 13.7 g/dL (ref 12.0–16.0)
MCH: 28.8 pg (ref 26.0–34.0)
MCHC: 33 g/dL (ref 32.0–36.0)
MCV: 87.2 fL (ref 80.0–100.0)
Platelets: 268 10*3/uL (ref 150–440)
RBC: 4.77 MIL/uL (ref 3.80–5.20)
RDW: 15.7 % — ABNORMAL HIGH (ref 11.5–14.5)
WBC: 9.7 10*3/uL (ref 3.6–11.0)

## 2015-02-21 LAB — MRSA PCR SCREENING: MRSA BY PCR: NEGATIVE

## 2015-02-21 MED ORDER — LORAZEPAM 2 MG/ML IJ SOLN
1.0000 mg | INTRAMUSCULAR | Status: DC | PRN
  Administered 2015-02-23: 1 mg via INTRAVENOUS
  Filled 2015-02-21: qty 1

## 2015-02-21 MED ORDER — LEVOFLOXACIN IN D5W 250 MG/50ML IV SOLN
250.0000 mg | INTRAVENOUS | Status: DC
  Filled 2015-02-21: qty 50

## 2015-02-21 MED ORDER — BISACODYL 10 MG RE SUPP: 10.0000 mg | Freq: Every day | RECTAL | Status: DC | PRN

## 2015-02-21 MED ORDER — MORPHINE SULFATE (CONCENTRATE) 10 MG/0.5ML PO SOLN: 5.0000 mg | ORAL | Status: DC | PRN

## 2015-02-21 MED ORDER — GLYCOPYRROLATE 0.2 MG/ML IJ SOLN
0.4000 mg | Freq: Four times a day (QID) | INTRAMUSCULAR | Status: DC | PRN
  Filled 2015-02-21: qty 2

## 2015-02-21 MED ORDER — HEPARIN SODIUM (PORCINE) 5000 UNIT/ML IJ SOLN: 5000.0000 [IU] | Freq: Two times a day (BID) | INTRAMUSCULAR | Status: DC

## 2015-02-21 MED ORDER — LEVOFLOXACIN IN D5W 500 MG/100ML IV SOLN: 500.0000 mg | INTRAVENOUS | Status: DC

## 2015-02-21 MED ORDER — MORPHINE 100MG IN NS 100ML (1MG/ML) PREMIX INFUSION
8.0000 mg/h | INTRAVENOUS | Status: DC
  Administered 2015-02-21: 15:00:00 3 mg/h via INTRAVENOUS
  Administered 2015-02-22: 6 mg/h via INTRAVENOUS
  Administered 2015-02-23: 8 mg/h via INTRAVENOUS
  Filled 2015-02-21 (×3): qty 100

## 2015-02-21 MED ORDER — SODIUM CHLORIDE 0.9 % IV SOLN
3.0000 mg/h | INTRAVENOUS | Status: DC
  Filled 2015-02-21: qty 10

## 2015-02-21 NOTE — Plan of Care (Signed)
Problem: Discharge Progression Outcomes Goal: Dyspnea controlled Outcome: Progressing Individualization of Care Pt prefers to be called [place] Hx of CHF, GERD, insomnia, hypothyroidism, HTN, dementia, stroke.  Admitted from [facility] after recent fall which resulted in right hip fracture and now hypoxia.    Goal: Other Discharge Outcomes/Goals Dyspnea:  Patient on Bipap and tolerating well.  No dysnpnea and O2 stats WNL. ADL:  Patient has chronic dementia and is wheel chair bound.  She is unable to complete ADL independently Barriers:  Patient is non-verbal.  She came to floor without family.  Unable to complete admission.  Plan to finish when son visits today. Discharge plan:  Plan to consider palliative consult and possible comfort care. Pain:  PAINAD and FLACC pain scales used to assess pain.  Patient without pain.  Scheduled Tylenol given per eMAR. Diet:  Patient currently NPO and needs swallow screen.   Activity:  Patient is severely contracted.  No ROM performed. Hemodynamically:  VSS this shift. BP 135/68 mmHg  Pulse 102  Temp(Src) 99.2 F (37.3 C) (Oral)  Resp 16  SpO2 93%

## 2015-02-21 NOTE — Progress Notes (Signed)
[facility] - [place] at [facility]   PATIENT NAME: Abigail Mcclure    MR#:  [id number]  DATE OF BIRTH:  1921/06/12  SUBJECTIVE:  CHIEF COMPLAINT:   Chief Complaint  Patient presents with  . Shortness of Breath   patient is 81 year old Caucasian female with history of CHF, dementia, stroke who presents to the hospital with hypoxia and shortness of breath. She was initiated on oxygen therapy per nasal cannula, however, required BiPAP placement due to severe hypoxemia. Patient is nonverbal and moans whenever moves around  Review of Systems  Unable to perform ROS: critical illness    VITAL SIGNS: Blood pressure 113/51, pulse 80, temperature 99 F (37.2 C), temperature source Oral, resp. rate 16, SpO2 96 %.  PHYSICAL EXAMINATION:   GENERAL:  81 y.o.-year-old patient lying in the bed in severe respiratory distress. Tachypneic, uncomfortable and distress. Bruising over the face of the of the left side as well as hands and arm on the left EYES: Pupils equal, round, reactive to light and accommodation. No scleral icterus. Extraocular muscles intact.  HEENT: Head atraumatic, normocephalic. Oropharynx and nasopharynx clear.  NECK:  Supple, no jugular venous distention. No thyroid enlargement, no tenderness.  LUNGS: Diminished breath sounds bilaterally, no wheezing, bilateral rales,rhonchi , some crepitations , mostly on the left. Using accessory muscles to breathe in severe respiratory distress, moaning.  CARDIOVASCULAR: S1, S2 , tachycardic. No murmurs, rubs, or gallops.  ABDOMEN: Soft, nontender, nondistended. Bowel sounds present. No organomegaly or mass.  EXTREMITIES: No pedal edema, cyanosis, or clubbing.  NEUROLOGIC: Cranial nerves grossly intact., Although difficult to evaluate due to BiPAP.  Muscle strength difficult to evaluate. Sensation not evaluated. Gait not checked.  PSYCHIATRIC: The patient is alert , able to assess orientation. Patient is nonverbal.  SKIN:  No obvious rash, lesion, or ulcer. Bruising on the left side of the body  ORDERS/RESULTS REVIEWED:   CBC  Recent Labs Lab 02-25-15 1719 02/21/15 0456  WBC 6.7 9.7  HGB 14.1 13.7  HCT 42.6 41.5  PLT 275 268  MCV 86.8 87.2  MCH 28.7 28.8  MCHC 33.0 33.0  RDW 15.7* 15.7*  LYMPHSABS 0.9*  --   MONOABS 1.1*  --   EOSABS 0.0  --   BASOSABS 0.0  --    ------------------------------------------------------------------------------------------------------------------  Chemistries   Recent Labs Lab 2015/02/25 1719 02/21/15 0456  NA 150* 153*  K 3.2* 5.1  CL 108 114*  CO2 28 27  GLUCOSE 150* 111*  BUN 36* 45*  CREATININE 1.39* 2.14*  CALCIUM 9.4 9.7   ------------------------------------------------------------------------------------------------------------------ estimated creatinine clearance is 13.8 mL/min (by C-G formula based on Cr of 2.14). ------------------------------------------------------------------------------------------------------------------ No results for input(s): TSH, T4TOTAL, T3FREE, THYROIDAB in the last 72 hours.  Invalid input(s): FREET3  Cardiac Enzymes  Recent Labs Lab 2015-02-25 1719  TROPONINI <0.03   ------------------------------------------------------------------------------------------------------------------ Invalid input(s): POCBNP ---------------------------------------------------------------------------------------------------------------  RADIOLOGY: Dg Chest Port 1 View  2015/02/25   CLINICAL DATA:  Patient unable to communicate post recent fall.  EXAM: PORTABLE CHEST 1 VIEW  COMPARISON:  02/15/2015 and earlier  FINDINGS: Elevated left hemidiaphragm. The cardiac borders are partially obscured by this elevation. There is new perihilar and interstitial density, consistent with mild pulmonary edema. No pleural effusions are identified.  No pneumothorax. No acute fractures identified. Old right ninth rib fracture.  IMPRESSION: Findings  consistent with early edema.   Electronically Signed   By: Norva Pavlov M.D.   On: 02-25-2015 17:30    EKG:  Orders placed or  performed during the hospital encounter of 03/16/2015  . EKG 12-Lead  . EKG 12-Lead  . ED EKG  . ED EKG    ASSESSMENT AND PLAN:  Active Problems:   Acute respiratory distress (HCC)   Pressure ulcer 1. Acute respiratory failure with hypoxia due to acute pulmonary edema due to CHF, continue oxygen therapy as needed. Patient is now on comfort care measures, supportive therapy only 2. Acute renal failure. Discontinue Lasix 3. Hypotension, discontinue metoprolol and discontinue Lasix. Blood pressure seemed to be improving. Patient is on comfort care measures now until supportive therapy only 4. Hypernatremia, due to the water deficit, patient is not able to eat or drink. Unfortunately, and I expect hypernatremia to worsen over time, no IV fluids due to congestive heart failure and the comfort care measures only   Management plans discussed with the patient, family and they are in agreement.   DRUG ALLERGIES: No Known Allergies  CODE STATUS:     Code Status Orders        Start     Ordered   02/25/2015 1946  Do not attempt resuscitation (DNR)   Continuous    Question Answer Comment  In the event of cardiac or respiratory ARREST Do not call a "code blue"   In the event of cardiac or respiratory ARREST Do not perform Intubation, CPR, defibrillation or ACLS   In the event of cardiac or respiratory ARREST Use medication by any route, position, wound care, and other measures to relive pain and suffering. May use oxygen, suction and manual treatment of airway obstruction as needed for comfort.      02/22/2015 1945      TOTAL CRITICAL CARE TIME TAKING CARE OF THIS PATIENT: 40 minutes.    Katharina Caper M.D on 02/21/2015 at 1:24 PM  Between 7am to 6pm - Pager - [phone]  After 6pm go to [email or web address] - password [email or web address] [facility]  [place]  Hospitalists   Office  [phone]  CC: Primary care physician; Lorie Phenix, MD

## 2015-02-21 NOTE — Progress Notes (Signed)
Per [facility] policy, heparin adjusted to 5000 units Q12H based on renal function.  Please adjust as needed if renal function improves.  Maryjo Rochester, PharmD Clinical Pharmacist 02/21/2015

## 2015-02-21 NOTE — Progress Notes (Signed)
Anticoagulation Policy  Patient is a 81 yo female receiving Lovenox 30 mg subq q24h for DVT prophylaxis.  Patient's renal function worsening with est CrCl~13.8 mL/min.  Per anticoagulation policy, will transition patient to heparin 5000 units subq q12h.   Hgb: 13.7, plts: 268  Pharmacy will continue to follow  Clarisa Schools, PharmD Clinical Pharmacist 02/21/2015

## 2015-02-21 NOTE — Consult Note (Signed)
Palliative Medicine Inpatient Consult Note   Name: Abigail Mcclure Date: 02/21/2015 MRN: [id number]  DOB: 1921-11-20  Referring Physician: Theodoro Grist, MD  Palliative Care consult requested for this 81 y.o. female for goals of medical therapy in patient with acute respiratory failure and multiple other medical problems.  IMPRESSION: 1.  Acute hypoxic respiratory failure ---due to acute pulmonary edema in the setting of acute renal failure ---no echo done as pt is to be comfort care as of today --but she is thought to have systolic CHF acutely 2.  Acute Systolic CHF --any chronic CHF is not known  3.  Acute Renal Failure --any chronic renal disease is not known 4.  Hypotension  --partly due to decreased oral intake --not eating for days --partly due to presumed systolic CHF 5.  Frequent Falls ---recent C1 fracture  ---son tells of her needing restraints in Wheelchair and the fact that the state does not allow this 6. Fall prior to this adm resulting in Right Hip Fracture and Left Facial contusions ---too unstable for surgery on hip ---has bleeding facial lacerations from fall 7.  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   Essential HTN 9.   Insomnia 10. GERD 11. Hypthyroidism 12.  Malnutrition --severe  13.  Hypernatremia   TODAY'S DISCUSSIONS, DECISIONS, AND PLANS:  1.  I met with son (there is only one son and he is also HCPOA) -- ---and pt is to be COMFORT TERMINAL CARE.  I described her CHF (fluid in the lungs) and the fact that diuretics will worsen the renal failure she is experiencing and that these two things are hard to treat in a very elderly person as one affects the other. The additional medical problems of hip fracture and advanced dementia complicate things greatly especially since she is not really eating lately.  He very much wants her not to suffer  and feels it is time for comfort care.  Pt will be started on a morphine drip (so it can be titrated if needed) and then we will remove the BIPAP and see how she does.    2.  If she appears to be dying soon, we won't ask for a Hospice Home consult.  3. If she is lingering, we will ask for a consult re: Hospice Home tomorrow morning.  Note that pt's husband died in [place] (he resided there for a month).  4.  Note that son's lifetime girlfriend/ companion died last year suddenly and unexpectedly of a ruptured AAA. He is tearful talking about this and I would like to refer him to grief counseling.    REVIEW OF SYSTEMS:  Patient is not able to provide ROS due to nonverbal state and advanced dementia.  SPIRITUAL SUPPORT SYSTEM: Yes  --son.  SOCIAL HISTORY:  reports that she has never smoked. She has never used smokeless tobacco. She reports that she does not drink alcohol or use illicit drugs.  LEGAL DOCUMENTS:  None  CODE STATUS: DNR  PAST MEDICAL HISTORY: Past Medical History  Diagnosis Date  . CHF (congestive heart failure) ([place])   . GERD (gastroesophageal reflux disease)   . Insomnia   . Hypothyroidism   . Hypertension   . Dementia   . Stroke [facility])     PAST SURGICAL HISTORY:  Past Surgical History  Procedure Laterality Date  . Abdominal hysterectomy      ALLERGIES:  has No Known Allergies.  MEDICATIONS:  Current Facility-Administered Medications  Medication Dose Route Frequency Provider Last Rate Last Dose  . acetaminophen (TYLENOL) suppository 650 mg  650 mg Rectal Q6H PRN Nicholes Mango, MD      . bisacodyl (DULCOLAX) suppository 10 mg  10 mg Rectal Daily PRN Colleen Can, MD      . glycopyrrolate (ROBINUL) injection 0.4 mg  0.4 mg Intravenous Q6H PRN Colleen Can, MD      . LORazepam (ATIVAN) injection 1 mg  1 mg Intravenous Q4H PRN Colleen Can, MD      . morphine 250 mg in sodium chloride 0.9 % 250 mL (1 mg/mL) infusion  3 mg/hr Intravenous  Continuous Colleen Can, MD      . morphine CONCENTRATE 10 MG/0.5ML oral solution 5 mg  5 mg Oral Q2H PRN Colleen Can, MD      . ondansetron [facility]) injection 4 mg  4 mg Intravenous Q6H PRN Nicholes Mango, MD        Vital Signs: BP 113/51 mmHg  Pulse 80  Temp(Src) 99 F (37.2 C) (Oral)  Resp 16  SpO2 96% There were no vitals filed for this visit.  Estimated body mass index is 23.50 kg/(m^2) as calculated from the following:   Height as of 02/15/15: 5' 2"  (1.575 m).   Weight as of 02/15/15: 58.299 kg (128 lb 8.4 oz).  PERFORMANCE STATUS (ECOG) : 4 - Bedbound  PHYSICAL EXAM: Lying in bed with BIPAP on and eyes partly open --in NAD EOMI Neck w tr JVD no TM Heart rrr distant HS Lungs with rales in bases and ronchi elsewhere Abd soft and NT with nl BS Ext no mottling or cyanosis as yet  LABS: CBC:    Component Value Date/Time   WBC 9.7 02/21/2015 0456   WBC 8.4 03/09/2013 0851   HGB 13.7 02/21/2015 0456   HGB 11.4* 03/09/2013 0851   HCT 41.5 02/21/2015 0456   HCT 32.9* 03/09/2013 0851   PLT 268 02/21/2015 0456   PLT 349 03/09/2013 0851   MCV 87.2 02/21/2015 0456   MCV 86 03/09/2013 0851   NEUTROABS 4.7 02/24/2015 1719   NEUTROABS 5.6 08/05/2011 1741   LYMPHSABS 0.9* 03/09/2015 1719   LYMPHSABS 1.2 08/05/2011 1741   MONOABS 1.1* 03/12/2015 1719   MONOABS 0.8* 08/05/2011 1741   EOSABS 0.0 03/02/2015 1719   EOSABS 0.0 08/05/2011 1741   BASOSABS 0.0 03/10/2015 1719   BASOSABS 0.0 08/05/2011 1741   Comprehensive Metabolic Panel:    Component Value Date/Time   NA 153* 02/21/2015 0456   NA 138 03/09/2013 0851   K 5.1 02/21/2015 0456   K 3.7 03/09/2013 0851   CL 114* 02/21/2015 0456   CL 105 03/09/2013 0851   CO2 27 02/21/2015 0456   CO2 27 03/09/2013 0851   BUN 45* 02/21/2015 0456   BUN 21* 03/09/2013 0851   CREATININE 2.14* 02/21/2015 0456   CREATININE 1.30 03/09/2013 0851   GLUCOSE 111* 02/21/2015 0456   GLUCOSE 103* 03/09/2013 0851   CALCIUM 9.7  02/21/2015 0456   CALCIUM 8.8 03/09/2013 0851   AST 24 03/09/2013 0851   ALT 18 03/09/2013 0851   ALKPHOS 115 03/09/2013 0851   BILITOT 0.3 03/09/2013 0851   PROT 6.8 03/09/2013 0851   ALBUMIN 3.0* 03/09/2013 0851     More than 50% of the visit was spent in counseling/coordination of care: Yes  Time Spent:  100 minutes

## 2015-02-21 NOTE — Care Management (Signed)
Admitted to [facility] with the diagnosis of acute respiratory failure. A resident of [facility] since 01/22/13. Son is Jillyn Hidden [phone]).  Sees Dr. Elease Hashimoto at [facility]. Low grade temperature = 99.2. IV Levaquin continues.  Bi-PAP for respiratory distress. Scheduled for VQ scan today. Gwenette Greet RN MSN Care Management [phone]

## 2015-02-21 NOTE — Plan of Care (Signed)
Problem: Discharge Progression Outcomes Goal: Other Discharge Outcomes/Goals Outcome: Not Applicable Date Met:  07/35/43 Patient made comfort care today, Dr. Megan Salon seen patient and spoke with Son, they are in agreement with plan, started on continuous morphine for comfort and discontinued bipap now on one liter, patient has remained comfortable.

## 2015-02-21 NOTE — Care Management Important Message (Signed)
Important Message  Patient Details  Name: Abigail Mcclure MRN: [id number] Date of Birth: 11-24-1921   Medicare Important Message Given:  Yes-second notification given    Gwenette Greet, RN 02/21/2015, 11:35 AM

## 2015-02-21 NOTE — Progress Notes (Signed)
ANTIBIOTIC RENAL ADJUSTMENT  Levaquin Indication: pneumonia  No Known Allergies  Patient Measurements:     Vital Signs: Temp: 99 F (37.2 C) (10/05 0525) Temp Source: Oral (10/05 0525) BP: 113/51 mmHg (10/05 0525) Pulse Rate: 80 (10/05 0525) Intake/Output from previous day:   Intake/Output from this shift:    Labs:  Recent Labs  03/17/2015 1719 02/21/15 0456  WBC 6.7 9.7  HGB 14.1 13.7  PLT 275 268  CREATININE 1.39* 2.14*   Estimated Creatinine Clearance: 13.8 mL/min (by C-G formula based on Cr of 2.14). No results for input(s): VANCOTROUGH, VANCOPEAK, VANCORANDOM, GENTTROUGH, GENTPEAK, GENTRANDOM, TOBRATROUGH, TOBRAPEAK, TOBRARND, AMIKACINPEAK, AMIKACINTROU, AMIKACIN in the last 72 hours.   Microbiology: Recent Results (from the past 720 hour(s))  Blood culture (routine x 2)     Status: None (Preliminary result)   Collection Time: 03/09/2015  6:10 PM  Result Value Ref Range Status   Specimen Description BLOOD RIGHT HAND  Final   Special Requests BOTTLES DRAWN AEROBIC AND ANAEROBIC  10CC  Final   Culture NO GROWTH < 24 HOURS  Final   Report Status PENDING  Incomplete  Blood culture (routine x 2)     Status: None (Preliminary result)   Collection Time: 02/18/2015  6:20 PM  Result Value Ref Range Status   Specimen Description BLOOD RIGHT ASSIST CONTROL  Final   Special Requests BOTTLES DRAWN AEROBIC AND ANAEROBIC  10CC  Final   Culture NO GROWTH < 24 HOURS  Final   Report Status PENDING  Incomplete  MRSA PCR Screening     Status: None   Collection Time: 02/21/2015 11:18 PM  Result Value Ref Range Status   MRSA by PCR NEGATIVE NEGATIVE Final    Comment:        The GeneXpert MRSA Assay (FDA approved for NASAL specimens only), is one component of a comprehensive MRSA colonization surveillance program. It is not intended to diagnose MRSA infection nor to guide or monitor treatment for MRSA infections.     Medical History: Past Medical History  Diagnosis Date   . CHF (congestive heart failure) (HCC)   . GERD (gastroesophageal reflux disease)   . Insomnia   . Hypothyroidism   . Hypertension   . Dementia   . Stroke [facility])     Medications:  Scheduled:  . acetaminophen  500 mg Oral Q4H  . aspirin EC  81 mg Oral Daily  . cyanocobalamin  1,000 mcg Intramuscular Q30 days  . divalproex  250 mg Oral BID  . docusate sodium  100 mg Oral BID  . famotidine  20 mg Oral QHS  . gabapentin  100 mg Oral QHS  . heparin subcutaneous  5,000 Units Subcutaneous Q12H  . hydrocortisone  1 application Rectal BID  . [START ON 02/22/2015] levofloxacin (LEVAQUIN) IV  500 mg Intravenous Q48H  . levothyroxine  100 mcg Oral QAC breakfast  . loratadine  10 mg Oral Daily  . montelukast  10 mg Oral Daily  . sodium chloride  3 mL Intravenous Q12H  . sodium chloride  3 mL Intravenous Q12H   Assessment: Patient is a 81 yo female with orders for Levaquin 250 mg IV q24h for HCAP.  Renal function worsened overnight and est CrCl~13.8 mL/min.  BCx: NGTD x 2, Sputum Cx: pending   Plan:  Will transition patient to Levaquin 500 mg IV q48h based on indication and renal function.  Pharmacy will continue to follow.  Dierra Riesgo G 02/21/2015,9:32 AM

## 2015-02-22 DIAGNOSIS — E870 Hyperosmolality and hypernatremia: Secondary | ICD-10-CM

## 2015-02-22 DIAGNOSIS — J810 Acute pulmonary edema: Secondary | ICD-10-CM

## 2015-02-22 DIAGNOSIS — I959 Hypotension, unspecified: Secondary | ICD-10-CM

## 2015-02-22 DIAGNOSIS — N179 Acute kidney failure, unspecified: Secondary | ICD-10-CM

## 2015-02-22 DIAGNOSIS — J9601 Acute respiratory failure with hypoxia: Secondary | ICD-10-CM

## 2015-02-22 MED ORDER — MORPHINE SULFATE (CONCENTRATE) 10 MG/0.5ML PO SOLN: 5.0000 mg | ORAL | Status: AC | PRN

## 2015-02-22 NOTE — Progress Notes (Signed)
   02/22/15 1652  Clinical Encounter Type  Visited With Patient and family together  Visit Type Initial;Spiritual support  Stress Factors  Family Stress Factors Major life changes;Health changes  Spoke to patient's son in room.  Pt non responsive.  Son told me "I'm ready for it to be over.  Don't want her to suffer anymore". Patient appeared resigned to patient's impending death, but "doing the best I can under the circumstances." Patient's significant other was also present in the room at patient's bedside.  Offered pastoral presence, compassion and support.  Will let on call chaplain know to be prepared if situation changes.  [employer] Demetres Prochnow-pager [phone]

## 2015-02-22 NOTE — Progress Notes (Signed)
New hospice home referral received from CSW [facility]. Information faxed to referral intake. Prior to Clinical research associate arriving at [facility] to meet with patient's family Dr. Orvan Falconer assessed Ms. Dray and found her too unstable to for transport. She remains on a morphine drip with hospital death expected. Her son is in agreement with patient remaining at [facility]. Referral intake notified, CSW Fredric Mare, staff RN Lance Bosch, [facility] Ste[facility] Drone and attending physician Dr. Winona Legato all aware of plan.. Thank you. Dayna Barker RN, BSN, [facility] [place], hospital Liaison [phone] c

## 2015-02-22 NOTE — Progress Notes (Addendum)
Palliative Medicine Inpatient Consult Follow Up Note   Name: Abigail Mcclure Date: 02/22/2015 MRN: [id number]  DOB: [birth year]  Referring Physician: Katharina Caper, MD  Palliative Care consult requested for this 81 y.o. female for goals of medical therapy in patient with acute respiratory failure and multiple other medical problems.  IMPRESSION: 1. Acute hypoxic respiratory failure ---approaching end of life ---due to acute pulmonary edema in the setting of acute renal failure ---no echo done as pt is to be comfort care as of today --but she is thought to have systolic CHF acutely 2. Acute Systolic CHF --any chronic CHF is not known  3. Acute Renal Failure ---would worsen with diuretics so this is not being done --any chronic renal disease is not known 4. Hypotension  --partly due to decreased oral intake --not eating for days --partly due to presumed systolic CHF 5. Frequent Falls ---recent C1 fracture  ---son tells of her needing restraints in Wheelchair and the fact that the state does not allow this 6. Fall prior to this adm resulting in Right Hip Fracture and Left Facial contusions ---too unstable for surgery on hip ---has bleeding facial lacerations from fall 7. 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 Essential HTN 9. Insomnia 10. GERD 11. Hypthyroidism 12. Malnutrition --severe  13. Hypernatremia   TODAY'S DISCUSSIONS AND DECISIONS Pt already has a discharge summary and packet to go to [facility]. Unfortunately, she did not yet have a set of vital signs done which are needed in order to determine is someone can safely be transported to [facility].  When vital signs were done late this am, they show some numbers that are borderline for stability.  Her BP is 80/35 with pulse OK and respirations OK--- BUT her sats are in the low 80's and  respiration pattern is changing over the morning.  These are very borderline numbers for transporting someone across town to [facility].   I communicated with Dayna Barker, Hospice Liaison, and we decided to tell son about the situation and ask what he wishes.  He feels like his mother is breathing the same shallow breathing that his father did, when he passed away some years ago (in [facility]). He thinks keeping her here is best since it won't be long.  I agree (but would have been in agreement to transfer her if he felt it was worth the move if she is showing signs of dying very soon).  He feels like keeping her here is best.    Due to some wincing and some signs of respiratory distress, I will increase the morphine slightly.  I have notified Dr. Harlow Ohms, nursing, Hospice Liaison, and entire care team. Have asked for a bereavement tray, chaplain consult, and a larger room if possible.       REVIEW OF SYSTEMS:  Patient is not able to provide ROS due to end of life / unresponsive state  CODE STATUS: DNR   PAST MEDICAL HISTORY: Past Medical History  Diagnosis Date  . CHF (congestive heart failure) (HCC)   . GERD (gastroesophageal reflux disease)   . Insomnia   . Hypothyroidism   . Hypertension   . Dementia   . Stroke [facility])     PAST SURGICAL HISTORY:  Past Surgical History  Procedure Laterality Date  . Abdominal hysterectomy      Vital Signs: BP 84/35 mmHg  Pulse 89  Temp(Src) 98.6 F (37 C) (Oral)  Resp 22  Ht 5' 2.01" (1.575 m)  Wt 57.3 kg (126 lb 5.2 oz)  BMI 23.10 kg/m2  SpO2 84% Filed Weights   02/21/15 2000  Weight: 57.3 kg (126 lb 5.2 oz)    Estimated body mass index is 23.1 kg/(m^2) as calculated from the following:   Height as of this encounter: 5' 2.01" (1.575 m).   Weight as of this encounter: 57.3 kg (126 lb 5.2 oz).  PHYSICAL EXAM: Lying in bed, unresponsive Mouth breathing --with 1 LPM nasal cannula Oxygen in place Neck w/o JVD or TM Heart rrr  no mgr Lungs quiet --decreased BS in bases I have just observed a few episodes of brief apnea and some snoring respirations Abd soft with nl BS Ext no mottling yet, some cyanosis  LABS: CBC:    Component Value Date/Time   WBC 9.7 02/21/2015 0456   WBC 8.4 03/09/2013 0851   HGB 13.7 02/21/2015 0456   HGB 11.4* 03/09/2013 0851   HCT 41.5 02/21/2015 0456   HCT 32.9* 03/09/2013 0851   PLT 268 02/21/2015 0456   PLT 349 03/09/2013 0851   MCV 87.2 02/21/2015 0456   MCV 86 03/09/2013 0851   NEUTROABS 4.7 03/04/2015 1719   NEUTROABS 5.6 08/05/2011 1741   LYMPHSABS 0.9* 02/27/2015 1719   LYMPHSABS 1.2 08/05/2011 1741   MONOABS 1.1* 02/27/2015 1719   MONOABS 0.8* 08/05/2011 1741   EOSABS 0.0 03/16/2015 1719   EOSABS 0.0 08/05/2011 1741   BASOSABS 0.0 03/12/2015 1719   BASOSABS 0.0 08/05/2011 1741   Comprehensive Metabolic Panel:    Component Value Date/Time   NA 153* 02/21/2015 0456   NA 138 03/09/2013 0851   K 5.1 02/21/2015 0456   K 3.7 03/09/2013 0851   CL 114* 02/21/2015 0456   CL 105 03/09/2013 0851   CO2 27 02/21/2015 0456   CO2 27 03/09/2013 0851   BUN 45* 02/21/2015 0456   BUN 21* 03/09/2013 0851   CREATININE 2.14* 02/21/2015 0456   CREATININE 1.30 03/09/2013 0851   GLUCOSE 111* 02/21/2015 0456   GLUCOSE 103* 03/09/2013 0851   CALCIUM 9.7 02/21/2015 0456   CALCIUM 8.8 03/09/2013 0851   AST 24 03/09/2013 0851   ALT 18 03/09/2013 0851   ALKPHOS 115 03/09/2013 0851   BILITOT 0.3 03/09/2013 0851   PROT 6.8 03/09/2013 0851   ALBUMIN 3.0* 03/09/2013 0851   More than 50% of the visit was spent in counseling/coordination of care: YES  Time Spent: 65 min

## 2015-02-22 NOTE — Plan of Care (Signed)
Problem: Discharge Progression Outcomes Goal: Other Discharge Outcomes/Goals Outcome: Progressing Plan of care progress to goal:  Patient remains unresponsive - slight response to pain noted. Morphine drip increased to 6 mg/hr. Patient to remain at [facility]. Bereavement tray provided. Family at bedside.

## 2015-02-22 NOTE — Plan of Care (Addendum)
Individualization of Care Hx of CHF, GERD, insomnia, hypothyroidism, HTN, dementia, stroke. Admitted from [facility] after recent fall which resulted in right hip fracture and now hypoxia.  Problem: Discharge Progression Outcomes Goal: Other Discharge Outcomes/Goals Barriers:  Patient non-verbal and non-responsive.  Responsive to pain only. Pain:  Patient on morphine drip at 56mL/hr.  Patient is comfortable and FLACC is a 0. Non-pain symptoms:  Non at this time. Complications:  Patient without complications VSS.  BP 113/51 mmHg  Pulse 80  Temp(Src) 99 F (37.2 C) (Oral)  Resp 16  Ht 5' 2.01" (1.575 m)  Wt 126 lb 5.2 oz (57.3 kg)  BMI 23.10 kg/m2  SpO2 96% Discharge home/hospice:  To be determined by physician tomorrow.

## 2015-02-22 NOTE — Plan of Care (Signed)
Problem: Discharge Progression Outcomes Goal: Discharge home/Hospice/SNF Outcome: Completed/Met Date Met:  02/22/15 Comfort care measures.

## 2015-02-22 NOTE — Progress Notes (Signed)
Per Palliative MD patient is not stable for transfer to hospice facility. Patient will remain at [facility].  Jetta Lout, LCSWA [phone]

## 2015-02-22 NOTE — Discharge Summary (Addendum)
[facility] [place] at [facility]   PATIENT NAME: Abigail Mcclure    MR#:  [id number]  DATE OF BIRTH:  1921-12-19  DATE OF ADMISSION:  03/07/2015 ADMITTING PHYSICIAN: Ramonita Lab, MD  DATE OF DISCHARGE: 2015/03/25.  PRIMARY CARE PHYSICIAN: Lorie Phenix, MD     ADMISSION DIAGNOSIS:  Dehydration [E86.0] Hypernatremia [E87.0] Respiratory distress [R06.00] General weakness [R53.1] Hip fracture, right, closed, with routine healing, subsequent encounter [S72.001D]  DISCHARGE DIAGNOSIS:  Principal Problem:   Acute respiratory failure with hypoxia (HCC) Active Problems:   Acute respiratory distress (HCC)   Hypernatremia   Acute pulmonary edema (HCC)   Acute renal failure (HCC)   Hypotension   Pressure ulcer   SECONDARY DIAGNOSIS:   Past Medical History  Diagnosis Date  . CHF (congestive heart failure) (HCC)   . GERD (gastroesophageal reflux disease)   . Insomnia   . Hypothyroidism   . Hypertension   . Dementia   . Stroke (HCC)     .pro HOSPITAL COURSE:   Patient is 81 year old Caucasian female with past medical history 81 significant for history of dementia, being nonverbal and wheelchair bound, history of falls and right hip fracture, nonoperative came to the hospital for shortness of breath and severe hypoxia, oxygen saturations of 88% on 4 L of oxygen through nasal cannula. Her chest x-ray revealed findings consistent with early edema. Patient was placed on BiPAP initiated on diuretic Lasix , but her condition did not improve and in fact, she developed acute renal failure. Palliative care was consulted and family made a decision for palliative care, hospice care. Patient was supposed to be discharged to hospice home  6 of October 2016, however, became a hemorrhoid, not medically unstable and was recommended to stay in the hospital for final care. She was placed on morphine IV drip slowly deteriorated and expired today on 03-25-2015. Discussion by  problem 1. Acute respiratory failure with hypoxia due to acute pulmonary edema due to CHF, oxygen therapy for comfort only . Morphine as needed. Patient deteriorated and expired today on October 7 thousand 16. Primary cause of death is acute pulmonary edema due to CHF. , Acute on chronic 2. Acute renal failure due to medical condition and diuretics, comfort care measures were initiated and patient expired 3. Hypotension, metoprolol and Lasix were discontinued. No further interventions were performed including IV fluid administration because of acute on chronic CHF.  Patient was managed on comfort care measures , supportive therapy and expired  4. Hypernatremia, due to the free water deficit, patient was not able to eat or drink due to critical medical condition. Comfort care measures only, no interventions were performed DISCHARGE CONDITIONS:   Patient expired  CONSULTS OBTAINED:  Treatment Team:  Ramonita Lab, MD  DRUG ALLERGIES:  No Known Allergies  DISCHARGE MEDICATIONS:   Current Discharge Medication List    START taking these medications   Details  Morphine Sulfate (MORPHINE CONCENTRATE) 10 MG/0.5ML SOLN concentrated solution Take 0.25 mLs (5 mg total) by mouth every 2 (two) hours as needed (Pain or shortness of breath). Qty: 42 mL, Refills: 0      STOP taking these medications     acetaminophen (TYLENOL) 325 MG tablet      acetaminophen (TYLENOL) 500 MG tablet      aspirin EC 81 MG tablet      cyanocobalamin (,VITAMIN B-12,) 1000 MCG/ML injection      divalproex (DEPAKOTE SPRINKLE) 125 MG capsule      famotidine (PEPCID) 20 MG  tablet      furosemide (LASIX) 20 MG tablet      gabapentin (NEURONTIN) 100 MG capsule      hydrocortisone (ANUSOL-HC) 2.5 % rectal cream      levothyroxine (SYNTHROID, LEVOTHROID) 100 MCG tablet      loratadine (CLARITIN) 10 MG tablet      LORazepam (ATIVAN) 0.5 MG tablet      magnesium hydroxide (MILK OF MAGNESIA) 400 MG/5ML suspension       metoprolol succinate (TOPROL-XL) 25 MG 24 hr tablet      mineral oil liquid      montelukast (SINGULAIR) 10 MG tablet      potassium chloride (KLOR-CON) 20 MEQ packet      traZODone (DESYREL) 50 MG tablet          DISCHARGE INSTRUCTIONS:    No follow-up  If you experience worsening of your admission symptoms, develop shortness of breath, life threatening emergency, suicidal or homicidal thoughts you must seek medical attention immediately by calling 911 or calling your MD immediately  if symptoms less severe.  You Must read complete instructions/literature along with all the possible adverse reactions/side effects for all the Medicines you take and that have been prescribed to you. Take any new Medicines after you have completely understood and accept all the possible adverse reactions/side effects.   Please note  You were cared for by a hospitalist during your hospital stay. If you have any questions about your discharge medications or the care you received while you were in the hospital after you are discharged, you can call the unit and asked to speak with the hospitalist on call if the hospitalist that took care of you is not available. Once you are discharged, your primary care physician will handle any further medical issues. Please note that NO REFILLS for any discharge medications will be authorized once you are discharged, as it is imperative that you return to your primary care physician (or establish a relationship with a primary care physician if you do not have one) for your aftercare needs so that they can reassess your need for medications and monitor your lab values.    Today   CHIEF COMPLAINT:   Chief Complaint  Patient presents with  . Shortness of Breath    HISTORY OF PRESENT ILLNESS:  Abigail Mcclure  is a 81 y.o. female with a known history of  dementia, being nonverbal and wheelchair bound, history of falls and right hip fracture, nonoperative came to  the hospital for shortness of breath and severe hypoxia, oxygen saturations of 88% on 4 L of oxygen through nasal cannula. Her chest x-ray revealed findings consistent with early edema. Patient was placed on BiPAP initiated on diuretic Lasix , but her condition did not improve and in fact, she developed acute renal failure. Palliative care was consulted and family made a decision for palliative care, hospice care. Patient was supposed to be discharged to hospice home  6 of October 2016, however, became a hemorrhoid, not medically unstable and was recommended to stay in the hospital for final care. She was placed on morphine IV drip slowly deteriorated and expired today on March 09, 2015. Discussion by problem 1. Acute respiratory failure with hypoxia due to acute pulmonary edema due to CHF, oxygen therapy for comfort only . Morphine as needed. Patient deteriorated and expired today on October 7 thousand 16. Primary cause of death is acute pulmonary edema due to CHF. , Acute on chronic 2. Acute renal failure due to medical  condition and diuretics, comfort care measures were initiated and patient expired 3. Hypotension, metoprolol and Lasix were discontinued. No further interventions were performed including IV fluid administration because of acute on chronic CHF.  Patient was managed on comfort care measures , supportive therapy and expired  4. Hypernatremia, due to the free water deficit, patient was not able to eat or drink due to critical medical condition. Comfort care measures only, no interventions were performed  VITAL SIGNS:  Blood pressure 113/51, pulse 80, temperature 99 F (37.2 C), temperature source Oral, resp. rate 16, height 5' 2.01" (1.575 m), weight 57.3 kg (126 lb 5.2 oz), SpO2 96 %.  I/O:  No intake or output data in the 24 hours ending 02/22/15 0908  PHYSICAL EXAMINATION:  GENERAL:  81 y.o.-year-old patient lying in the bed in severe respiratory distress. Tachypneic, tachycardic, not  responding to painful or verbal stimuli EYES: Pupils equal, round, 1 mm, orally reactive to light and accommodation, due to morphine IV drip. No scleral icterus. Extraocular muscles not able to assess.  HEENT: Head atraumatic, normocephalic. Oropharynx and nasopharynx clear, dry breathing through the mouth.  NECK:  Supple, no jugular venous distention. No thyroid enlargement, no tenderness.  LUNGS: Normal breath sounds bilaterally, no wheezing, but bilateral rales,rhonchi , and intermittent crepitation. Using accessory muscles of respiration.  CARDIOVASCULAR: S1, S2 normal. Tachycardic . Irregularly irregular . No murmurs, rubs, or gallops.  ABDOMEN: Soft, non-tender, non-distended. Bowel sounds present. No organomegaly or mass.  EXTREMITIES: No pedal edema, cyanosis, or clubbing.  NEUROLOGIC: Cranial nerves II through XII are intact. Muscle strength 5/5 in all extremities. Sensation intact. Gait not checked.  PSYCHIATRIC: The patient is alert and oriented x 3.  SKIN: No obvious rash, lesion, or ulcer.   DATA REVIEW:   CBC  Recent Labs Lab 02/21/15 0456  WBC 9.7  HGB 13.7  HCT 41.5  PLT 268    Chemistries   Recent Labs Lab 02/21/15 0456  NA 153*  K 5.1  CL 114*  CO2 27  GLUCOSE 111*  BUN 45*  CREATININE 2.14*  CALCIUM 9.7    Cardiac Enzymes  Recent Labs Lab 03/17/2015 1719  TROPONINI <0.03    Microbiology Results  Results for orders placed or performed during the hospital encounter of 03/16/2015  Blood culture (routine x 2)     Status: None (Preliminary result)   Collection Time: 03/17/2015  6:10 PM  Result Value Ref Range Status   Specimen Description BLOOD RIGHT HAND  Final   Special Requests BOTTLES DRAWN AEROBIC AND ANAEROBIC  10CC  Final   Culture NO GROWTH < 24 HOURS  Final   Report Status PENDING  Incomplete  Blood culture (routine x 2)     Status: None (Preliminary result)   Collection Time: 03/01/2015  6:20 PM  Result Value Ref Range Status   Specimen  Description BLOOD RIGHT ASSIST CONTROL  Final   Special Requests BOTTLES DRAWN AEROBIC AND ANAEROBIC  10CC  Final   Culture NO GROWTH < 24 HOURS  Final   Report Status PENDING  Incomplete  MRSA PCR Screening     Status: None   Collection Time: 03/09/2015 11:18 PM  Result Value Ref Range Status   MRSA by PCR NEGATIVE NEGATIVE Final    Comment:        The GeneXpert MRSA Assay (FDA approved for NASAL specimens only), is one component of a comprehensive MRSA colonization surveillance program. It is not intended to diagnose MRSA infection nor to guide  or monitor treatment for MRSA infections.     RADIOLOGY:  Dg Chest Port 1 View  02/24/15   CLINICAL DATA:  Patient unable to communicate post recent fall.  EXAM: PORTABLE CHEST 1 VIEW  COMPARISON:  02/15/2015 and earlier  FINDINGS: Elevated left hemidiaphragm. The cardiac borders are partially obscured by this elevation. There is new perihilar and interstitial density, consistent with mild pulmonary edema. No pleural effusions are identified.  No pneumothorax. No acute fractures identified. Old right ninth rib fracture.  IMPRESSION: Findings consistent with early edema.   Electronically Signed   By: Norva Pavlov M.D.   On: 24-Feb-2015 17:30    EKG:   Orders placed or performed during the hospital encounter of 02-24-2015  . EKG 12-Lead  . EKG 12-Lead  . ED EKG  . ED EKG      Management plans discussed with the patient, family and they are in agreement.  CODE STATUS:     Code Status Orders        Start     Ordered   Feb 24, 2015 1946  Do not attempt resuscitation (DNR)   Continuous    Question Answer Comment  In the event of cardiac or respiratory ARREST Do not call a "code blue"   In the event of cardiac or respiratory ARREST Do not perform Intubation, CPR, defibrillation or ACLS   In the event of cardiac or respiratory ARREST Use medication by any route, position, wound care, and other measures to relive pain and suffering.  May use oxygen, suction and manual treatment of airway obstruction as needed for comfort.      02/24/2015 1945      TOTAL TIME TAKING CARE OF THIS PATIENT: 40 minutes.    Katharina Caper M.D on 02/22/2015 at 9:08 AM  Between 7am to 6pm - Pager - [phone]  After 6pm go to [email or web address] - password [email or web address] [facility]  [facility]  Office  [phone]  CC: Primary care physician; Lorie Phenix, MD

## 2015-02-22 NOTE — Progress Notes (Signed)
Per MD patient will transfer to the [facility] today. Clinical Child psychotherapist (CSW) contacted [facility] and made her aware of above. CSW prepared D/C packet including DNR. RN aware of above. Please reconsult if future social work needs arise. CSW signing off.   Jetta Lout, LCSWA [phone]

## 2015-02-23 DIAGNOSIS — R400 Somnolence: Secondary | ICD-10-CM

## 2015-02-23 MED ORDER — LORAZEPAM 2 MG/ML IJ SOLN
1.0000 mg | INTRAMUSCULAR | Status: DC
Start: 2015-02-23 — End: 2015-02-23

## 2015-02-25 LAB — CULTURE, BLOOD (ROUTINE X 2)
Culture: NO GROWTH
Culture: NO GROWTH

## 2015-03-20 NOTE — Plan of Care (Addendum)
Problem: Discharge Progression Outcomes Goal: Other Discharge Outcomes/Goals Outcome: Not Applicable Date Met:  08/71/99 Patient resting comfortably with Son at bedside grieving appropriately.  Patient appears comfortable with continuous morphine drip infusing, will continue to assess comfort.

## 2015-03-20 NOTE — Progress Notes (Signed)
Patient passed away at 3 pm Dr. Orvan Falconer pronounced and notified her Son Abigail Mcclure.

## 2015-03-20 NOTE — Progress Notes (Signed)
Death Pronouncement   I was notified by nursing that pt had no signs of life at 3pm today.  I was nearby and examined pt and found her to have no respirations, no heartbeat, no pulses, and no visible signs of life. Her pupils were dilated and fixed.  No breath or lung sounds were heard.  She is pronounced dead at 3 pm today, 2015/03/18.  Son was notified by me.   Suan Halter, MD

## 2015-03-20 NOTE — Progress Notes (Signed)
Nutrition Brief Note  Chart reviewed. Pt now transitioning to comfort care.  Pt remains NPO at this time. No further nutrition interventions warranted at this time.  Please re-consult as needed.   Leda Quail, [place], LDN Pager [phone]

## 2015-03-20 NOTE — Progress Notes (Signed)
Palliative Care Update  Pt is reexamined. Son in bedside. Friend was visiting.  Pt is breathing extremely shallow breaths.  She had moved her arms in an uncomfortable manner per son, so she will get some Ativan.    I think she truly is dying very soon and nurse also feels this to be the case.    I have informed Care Mgr and spoken with son.  Vital signs being rechecked now.  Cammie Mcgee, MD

## 2015-03-20 NOTE — Progress Notes (Signed)
Palliative Medicine Inpatient Consult Follow Up Note   Name: Abigail Mcclure Date: 02/18/2015 MRN: [id number]  DOB: July 22, 1921  Referring Physician: Katharina Caper, MD  Palliative Care consult requested for this 81 y.o. female for goals of medical therapy in patient with acute respiratory failure and multiple other medical problems.  She was due to go to [facility] yesterday, but when I had to determine if she was stable for transport, her VS were borderline for being stable. At that time, I asked the son what he wanted. He did not feel strongly one way or the other, but when he noted that her respirations were shallow and sometimes gasping, he thought this was just like his father's last hours. Thinking that she was in her last few hours, he opted to keep her here instead of transported to [facility].  Now, we find that her sats have actually improved some since yesterday, likely due to the vasodilitory effects of the morphine she has been getting. She actually seems more stable for transport today than yesterday.  She still is unresponsive and terminal, but I will again ask son if he thinks she should go to [facility] today.     IMPRESSION: 1. Acute hypoxic respiratory failure ---due to acute pulmonary edema in the setting of acute renal failure ---no echo done as pt is to be comfort care as of today --but she is thought to have systolic CHF acutely 2. Acute Systolic CHF --any chronic CHF is not known  3. Acute Renal Failure --any chronic renal disease is not known 4. Hypotension  --partly due to decreased oral intake --not eating for days --partly due to presumed systolic CHF 5. Frequent Falls ---recent C1 fracture  ---son tells of her needing restraints in Wheelchair and the fact that the state does not allow this 6. Fall prior to this adm resulting in Right Hip Fracture and Left Facial contusions ---too unstable for surgery on hip ---has bleeding facial lacerations from  fall 7. 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 Essential HTN 9. Insomnia 10. GERD 11. Hypthyroidism 12. Malnutrition --severe  13. Hypernatremia  TODAY'S DISCUSSIONS AND DECISIONS: Pt is now on 8 mg of morphine per hour. She did have some altered respirations about an hour ago, but is breathing quite evenly now.  Discussed with nurse and care mgr and Dr.Vaiscute (attending) this am.  Will check back on pt in a couple of hours and talk with son when he comes in soon. DNR and terminal comfort care continue.   REVIEW OF SYSTEMS:  Patient is not able to provide ROS due to being unresponsive  CODE STATUS: DNR   PAST MEDICAL HISTORY: Past Medical History  Diagnosis Date  . CHF (congestive heart failure) (HCC)   . GERD (gastroesophageal reflux disease)   . Insomnia   . Hypothyroidism   . Hypertension   . Dementia   . Stroke [facility])     PAST SURGICAL HISTORY:  Past Surgical History  Procedure Laterality Date  . Abdominal hysterectomy      Vital Signs: BP 85/36 mmHg  Pulse 78  Temp(Src) 100.9 F (38.3 C) (Oral)  Resp 14  Ht 5' 2.01" (1.575 m)  Wt 57.3 kg (126 lb 5.2 oz)  BMI 23.10 kg/m2  SpO2 90% Filed Weights   02/21/15 2000  Weight: 57.3 kg (126 lb 5.2 oz)    Estimated body  mass index is 23.1 kg/(m^2) as calculated from the following:   Height as of this encounter: 5' 2.01" (1.575 m).   Weight as of this encounter: 57.3 kg (126 lb 5.2 oz).  PHYSICAL EXAM: NAD She winced some on my exam Unresponsive Respirations even but shallow Pale Eyes closed No JVD Heart rrr distant Lungs decreased BS bases Abd soft and NT Ext some minimal cyanosis of toes but not feet.   More than 50% of the visit was spent in counseling/coordination of care: YES  Time Spent: 35 min

## 2015-03-20 NOTE — Care Management Important Message (Signed)
Important Message  Patient Details  Name: Abigail Mcclure MRN: [id number] Date of Birth: 1921/07/26   Medicare Important Message Given:  Yes-third notification given    Gwenette Greet, RN 03-23-15, 8:45 AM

## 2015-03-20 NOTE — Progress Notes (Signed)
Dr Orvan Falconer present on unit at time of death, Dr Winona Legato also made aware, Supervisor and son Jillyn Hidden aware, [facility] called pt not a candidate

## 2015-03-20 NOTE — Progress Notes (Signed)
[facility] [place] [facility]   PATIENT NAME: Abigail Mcclure    MR#:  [id number]  DATE OF BIRTH:  [birth year]  SUBJECTIVE:  CHIEF COMPLAINT:   Chief Complaint  Patient presents with  . Shortness of Breath   patient is 81 year old Caucasian female with history of CHF, dementia, stroke who presents to the hospital with hypoxia and shortness of breath. She was initiated on oxygen therapy per nasal cannula, however, required BiPAP placement due to severe hypoxemia. Initiated comfort care measures and the initially thought to be discharged to hospice home, however, became unstable and is continued on comfort care measures here in the hospital. Hypoxia today with O2 sats of 66. Palliative care is involved.  No Review of systems available as patient is unresponsive   Review of Systems  Unable to perform ROS: critical illness    VITAL SIGNS: Blood pressure 89/35, pulse 88, temperature 100.9 F (38.3 C), temperature source Oral, resp. rate 14, height 5' 2.01" (1.575 m), weight 57.3 kg (126 lb 5.2 oz), SpO2 66 %.  PHYSICAL EXAMINATION:   GENERAL:  80 y.o.-year-old patient lying in the bed in moderate  respiratory distress. Tachypneic, shallow respirations. Bruising over the face of the of the left side as well as hands and arm on the left EYES: Pupils equal, round, reactive to light and accommodation. No scleral icterus. Extraocular muscles intact.  HEENT: Head atraumatic, normocephalic. Oropharynx and nasopharynx clear.  NECK:  Supple, no jugular venous distention. No thyroid enlargement, no tenderness.  LUNGS: Diminished breath sounds bilaterally, shallow respirations. Tachypneic no wheezing, bilateral rales,rhonchi , some crepitations  Using accessory muscles to breathe in moderate respiratory distress, but overall relatively comfortable.  CARDIOVASCULAR: S1, S2 , tachycardic. No murmurs, rubs, or gallops.  ABDOMEN: Soft, nontender, nondistended. Bowel sounds  present. No organomegaly or mass.  EXTREMITIES: No pedal edema, cyanosis, or clubbing.  NEUROLOGIC: Cranial nerves grossly intact.,   Muscle strength difficult to evaluate. Sensation not evaluated. Gait not checked.  PSYCHIATRIC: The patient is alert , able to assess orientation. Patient is nonverbal.  SKIN: No obvious rash, lesion, or ulcer. Bruising on the left side of the body  ORDERS/RESULTS REVIEWED:   CBC  Recent Labs Lab 02/22/2015 1719 02/21/15 0456  WBC 6.7 9.7  HGB 14.1 13.7  HCT 42.6 41.5  PLT 275 268  MCV 86.8 87.2  MCH 28.7 28.8  MCHC 33.0 33.0  RDW 15.7* 15.7*  LYMPHSABS 0.9*  --   MONOABS 1.1*  --   EOSABS 0.0  --   BASOSABS 0.0  --    ------------------------------------------------------------------------------------------------------------------  Chemistries   Recent Labs Lab 03/13/2015 1719 02/21/15 0456  NA 150* 153*  K 3.2* 5.1  CL 108 114*  CO2 28 27  GLUCOSE 150* 111*  BUN 36* 45*  CREATININE 1.39* 2.14*  CALCIUM 9.4 9.7   ------------------------------------------------------------------------------------------------------------------ estimated creatinine clearance is 13.8 mL/min (by C-G formula based on Cr of 2.14). ------------------------------------------------------------------------------------------------------------------ No results for input(s): TSH, T4TOTAL, T3FREE, THYROIDAB in the last 72 hours.  Invalid input(s): FREET3  Cardiac Enzymes  Recent Labs Lab 02/25/2015 1719  TROPONINI <0.03   ------------------------------------------------------------------------------------------------------------------ Invalid input(s): POCBNP ---------------------------------------------------------------------------------------------------------------  RADIOLOGY: No results found.  EKG:  Orders placed or performed during the hospital encounter of 03/06/2015  . EKG 12-Lead  . EKG 12-Lead  . ED EKG  . ED EKG    ASSESSMENT AND  PLAN:  Principal Problem:   Acute respiratory failure with hypoxia (HCC) Active Problems:   Acute respiratory  distress (HCC)   Hypernatremia   Acute pulmonary edema (HCC)   Acute renal failure (HCC)   Hypotension   Pressure ulcer 1. Acute respiratory failure with hypoxia due to acute pulmonary edema due to CHF, continue oxygen therapy as needed. Patient is now on comfort care measures, supportive therapy only, due to patient being hemodynamically unstable, she will stay in the hospital for final care 2. Acute renal failure. Of Lasix, may have some improved on morphine drip 3. Hypotension, of metoprolol and Lasix. Blood pressure is low but stable. Patient is on comfort care measures now until supportive therapy only 4. Hypernatremia, due to the water deficit, patient is not able to eat or drink. Since patient is volume depleted. I think her CHF may have improved some over the past 24 hours. Comfort care measures. Continue supportive therapy only   Management plans discussed with the patient, family and they are in agreement.   DRUG ALLERGIES: No Known Allergies  CODE STATUS:     Code Status Orders        Start     Ordered   03/17/2015 1946  Do not attempt resuscitation (DNR)   Continuous    Question Answer Comment  In the event of cardiac or respiratory ARREST Do not call a "code blue"   In the event of cardiac or respiratory ARREST Do not perform Intubation, CPR, defibrillation or ACLS   In the event of cardiac or respiratory ARREST Use medication by any route, position, wound care, and other measures to relive pain and suffering. May use oxygen, suction and manual treatment of airway obstruction as needed for comfort.      03/09/2015 1945      TOTAL CRITICAL CARE TIME TAKING CARE OF THIS PATIENT: .    Katharina Caper M.D on 03/22/15 at 1:22 PM  Between 7am to 6pm - Pager - [phone]  After 6pm go to [email or web address] - password [email or web address] [facility]  [place]  [facility]  Office  [phone]  CC: Primary care physician; Lorie Phenix, MD

## 2015-03-20 NOTE — Plan of Care (Signed)
Problem: Discharge Progression Outcomes Goal: Other Discharge Outcomes/Goals Outcome: Progressing Plan of care progress to goal: Pt continues to be unresponsive except to pain.  Morphine continues at 6mg /hr Pt to remain at [facility]

## 2015-03-20 DEATH — deceased

## 2016-05-31 IMAGING — CR DG KNEE 1-2V*R*
2 series · 2 of 2 positions shown · non-contrast
Comparison: None.

CLINICAL DATA: Fell out of a wheelchair today.  Right knee pain.

EXAM:
RIGHT KNEE - 1-2 VIEW

[knee ap]
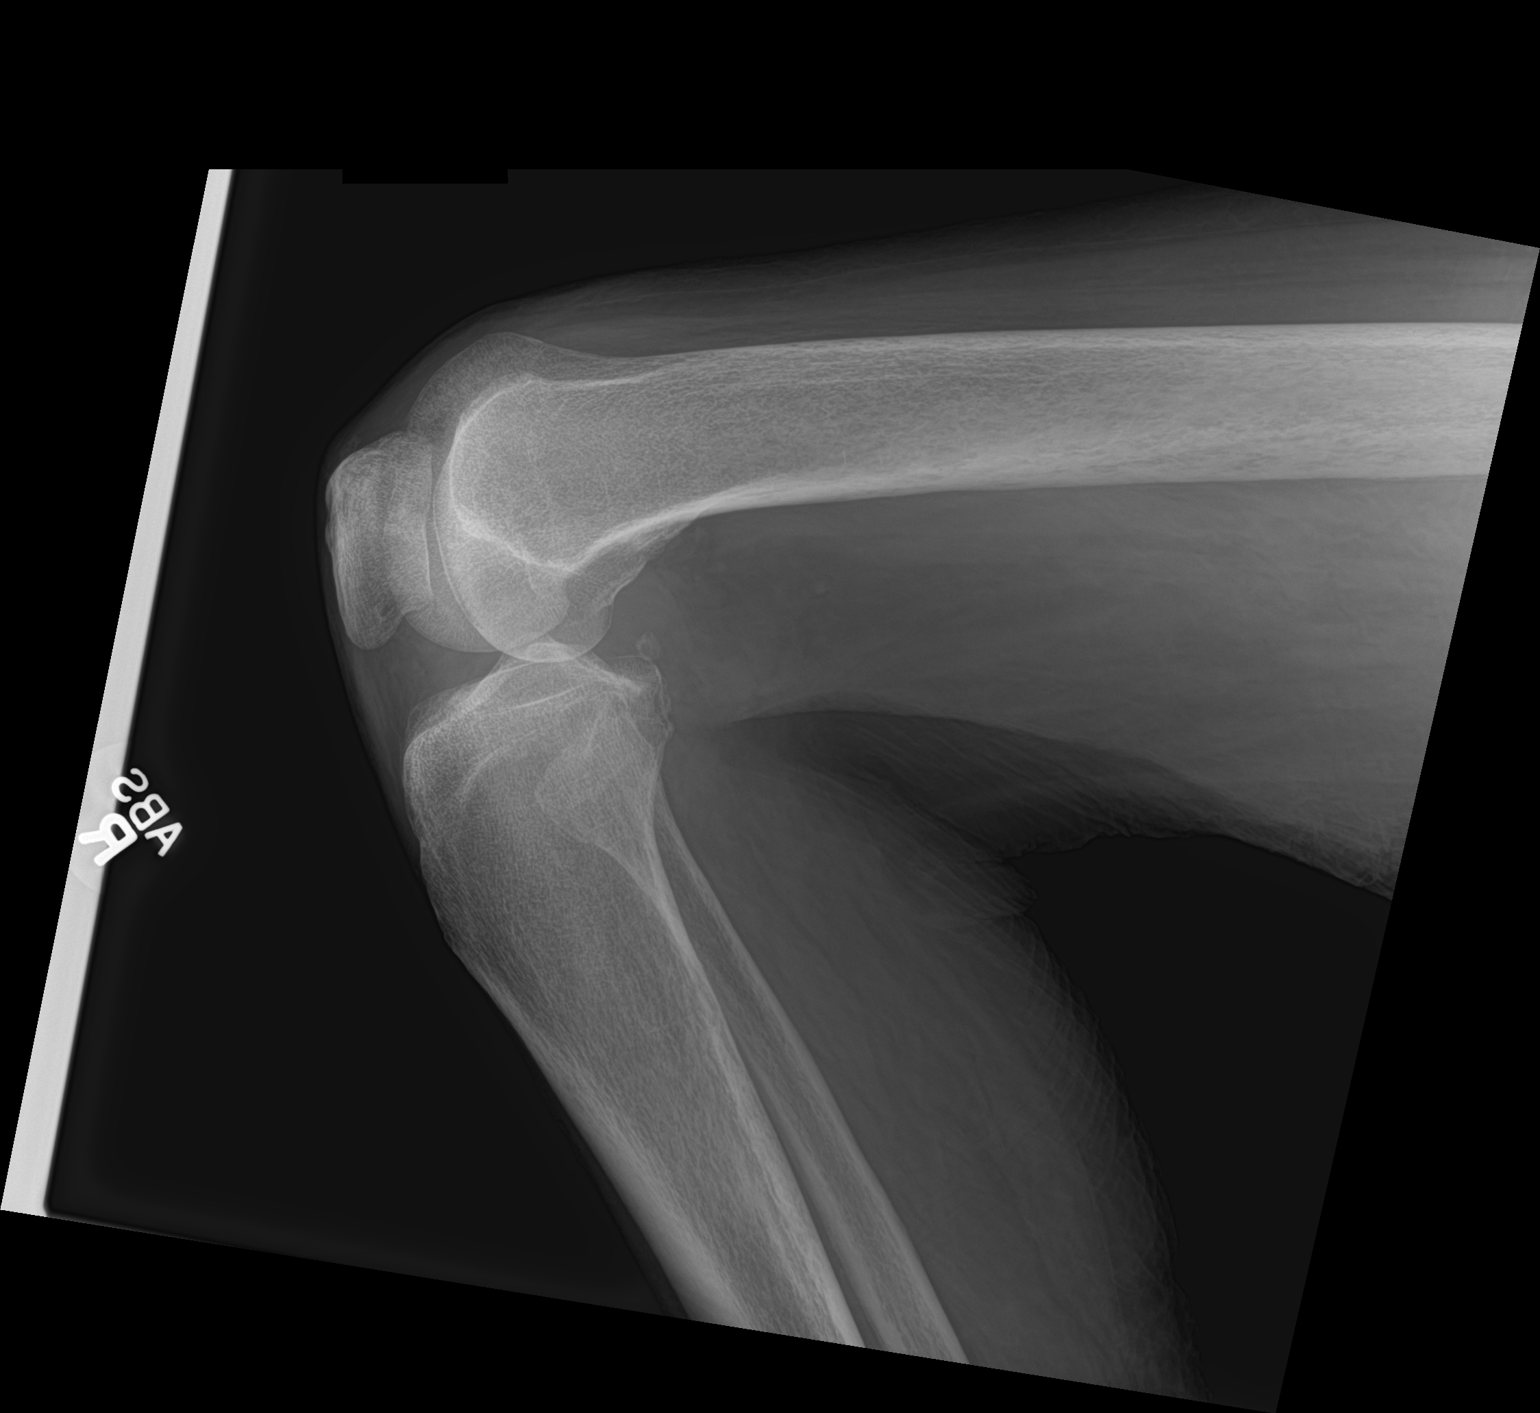

[knee lat]
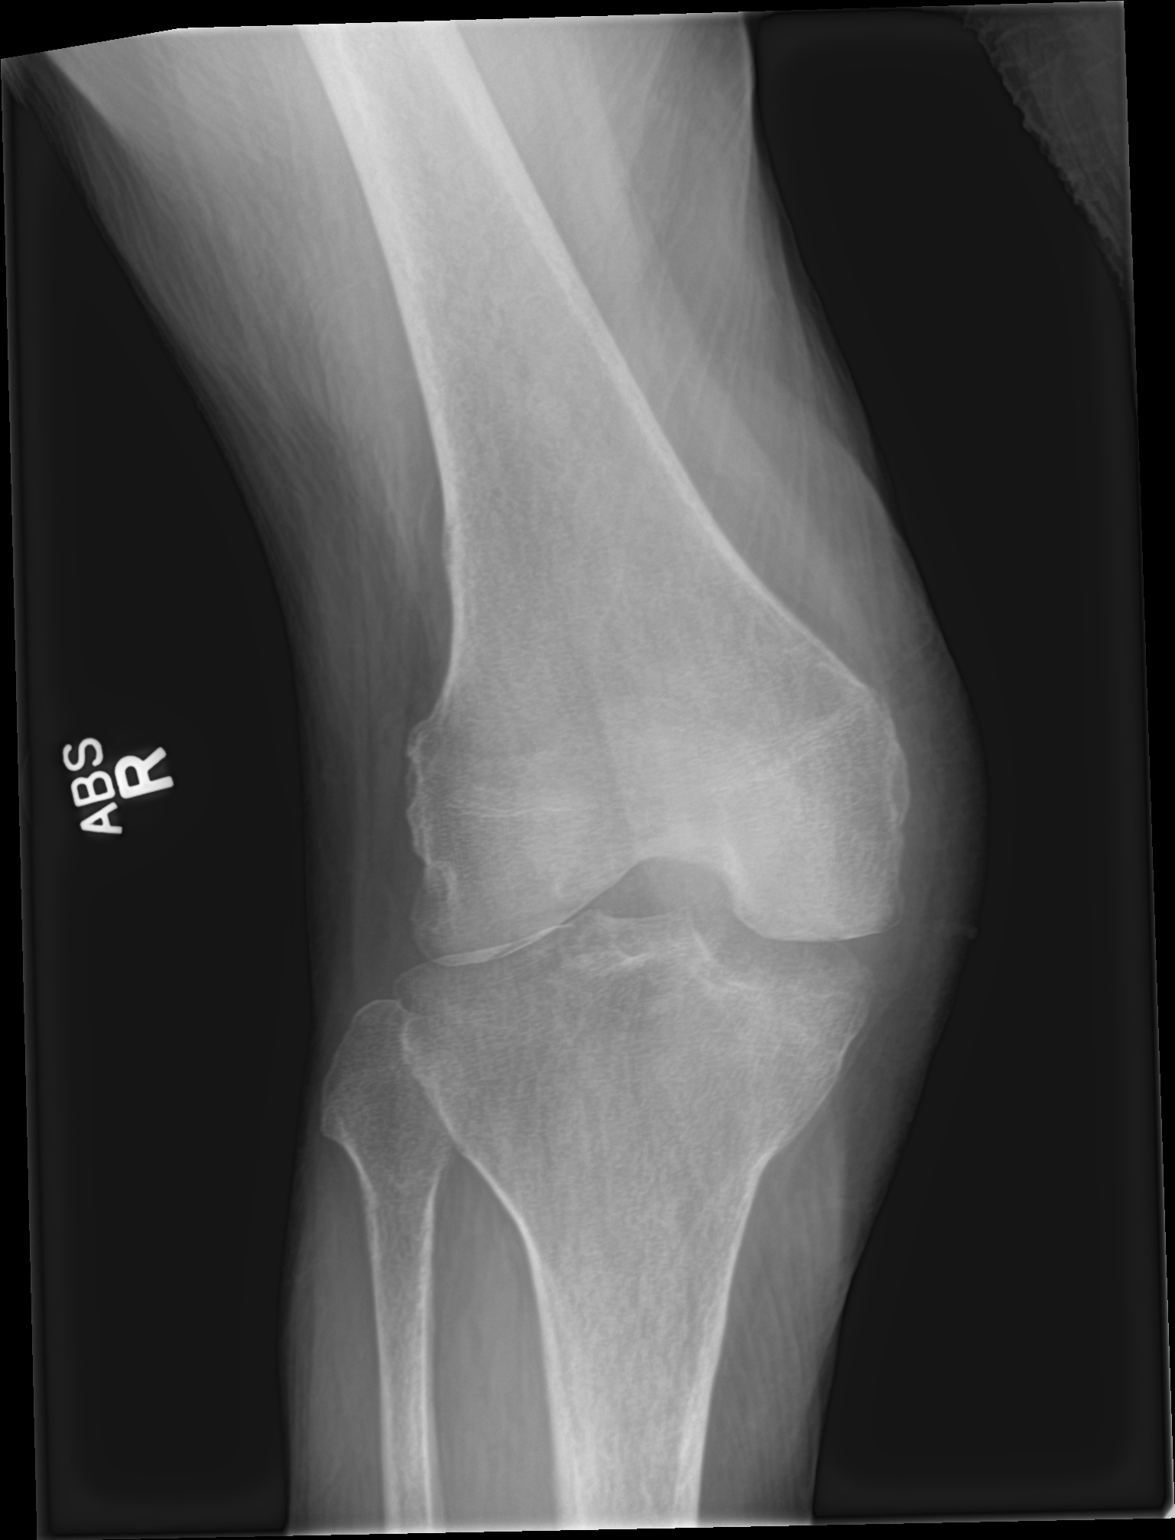

[2 of 2 positions shown; findings below may reference images not displayed]

FINDINGS: The joint spaces are maintained. No acute fracture or obvious joint
effusion.
IMPRESSION: No acute bony findings.
